# Patient Record
Sex: Female | Born: 1935 | Race: White | Hispanic: No | Smoking: Never smoker
Health system: Southern US, Community
[De-identification: ages and names within clinical notes are randomized; demographics above are authoritative.]

## PROBLEM LIST (undated history)

## (undated) DIAGNOSIS — I4891 Unspecified atrial fibrillation: Secondary | ICD-10-CM

---

## 2011-07-18 DIAGNOSIS — D510 Vitamin B12 deficiency anemia due to intrinsic factor deficiency: Secondary | ICD-10-CM | POA: Diagnosis not present

## 2011-07-18 DIAGNOSIS — Z7901 Long term (current) use of anticoagulants: Secondary | ICD-10-CM | POA: Diagnosis not present

## 2011-08-15 DIAGNOSIS — Z7901 Long term (current) use of anticoagulants: Secondary | ICD-10-CM | POA: Diagnosis not present

## 2011-08-15 DIAGNOSIS — D510 Vitamin B12 deficiency anemia due to intrinsic factor deficiency: Secondary | ICD-10-CM | POA: Diagnosis not present

## 2011-09-12 DIAGNOSIS — Z7901 Long term (current) use of anticoagulants: Secondary | ICD-10-CM | POA: Diagnosis not present

## 2011-09-12 DIAGNOSIS — D510 Vitamin B12 deficiency anemia due to intrinsic factor deficiency: Secondary | ICD-10-CM | POA: Diagnosis not present

## 2011-09-30 DIAGNOSIS — S199XXA Unspecified injury of neck, initial encounter: Secondary | ICD-10-CM | POA: Diagnosis not present

## 2011-09-30 DIAGNOSIS — Z7901 Long term (current) use of anticoagulants: Secondary | ICD-10-CM | POA: Diagnosis not present

## 2011-09-30 DIAGNOSIS — R296 Repeated falls: Secondary | ICD-10-CM | POA: Diagnosis not present

## 2011-09-30 DIAGNOSIS — I10 Essential (primary) hypertension: Secondary | ICD-10-CM | POA: Diagnosis not present

## 2011-09-30 DIAGNOSIS — R040 Epistaxis: Secondary | ICD-10-CM | POA: Diagnosis not present

## 2011-09-30 DIAGNOSIS — S0993XA Unspecified injury of face, initial encounter: Secondary | ICD-10-CM | POA: Diagnosis not present

## 2011-09-30 DIAGNOSIS — IMO0002 Reserved for concepts with insufficient information to code with codable children: Secondary | ICD-10-CM | POA: Diagnosis not present

## 2011-09-30 DIAGNOSIS — S022XXA Fracture of nasal bones, initial encounter for closed fracture: Secondary | ICD-10-CM | POA: Diagnosis not present

## 2011-09-30 DIAGNOSIS — I2510 Atherosclerotic heart disease of native coronary artery without angina pectoris: Secondary | ICD-10-CM | POA: Diagnosis not present

## 2011-09-30 DIAGNOSIS — S0120XA Unspecified open wound of nose, initial encounter: Secondary | ICD-10-CM | POA: Diagnosis not present

## 2011-10-12 DIAGNOSIS — D510 Vitamin B12 deficiency anemia due to intrinsic factor deficiency: Secondary | ICD-10-CM | POA: Diagnosis not present

## 2011-10-12 DIAGNOSIS — Z7901 Long term (current) use of anticoagulants: Secondary | ICD-10-CM | POA: Diagnosis not present

## 2011-10-17 DIAGNOSIS — Z7901 Long term (current) use of anticoagulants: Secondary | ICD-10-CM | POA: Diagnosis not present

## 2011-10-31 DIAGNOSIS — Z7901 Long term (current) use of anticoagulants: Secondary | ICD-10-CM | POA: Diagnosis not present

## 2011-11-02 DIAGNOSIS — Z7901 Long term (current) use of anticoagulants: Secondary | ICD-10-CM | POA: Diagnosis not present

## 2011-11-05 DIAGNOSIS — Z7901 Long term (current) use of anticoagulants: Secondary | ICD-10-CM | POA: Diagnosis not present

## 2011-11-12 DIAGNOSIS — Z7901 Long term (current) use of anticoagulants: Secondary | ICD-10-CM | POA: Diagnosis not present

## 2011-11-12 DIAGNOSIS — D510 Vitamin B12 deficiency anemia due to intrinsic factor deficiency: Secondary | ICD-10-CM | POA: Diagnosis not present

## 2011-11-12 DIAGNOSIS — I4891 Unspecified atrial fibrillation: Secondary | ICD-10-CM | POA: Diagnosis not present

## 2011-11-19 DIAGNOSIS — Z7901 Long term (current) use of anticoagulants: Secondary | ICD-10-CM | POA: Diagnosis not present

## 2011-12-04 DIAGNOSIS — Z7901 Long term (current) use of anticoagulants: Secondary | ICD-10-CM | POA: Diagnosis not present

## 2011-12-10 DIAGNOSIS — Z7901 Long term (current) use of anticoagulants: Secondary | ICD-10-CM | POA: Diagnosis not present

## 2011-12-24 DIAGNOSIS — Z7901 Long term (current) use of anticoagulants: Secondary | ICD-10-CM | POA: Diagnosis not present

## 2011-12-24 DIAGNOSIS — D510 Vitamin B12 deficiency anemia due to intrinsic factor deficiency: Secondary | ICD-10-CM | POA: Diagnosis not present

## 2011-12-31 DIAGNOSIS — Z7901 Long term (current) use of anticoagulants: Secondary | ICD-10-CM | POA: Diagnosis not present

## 2012-01-23 DIAGNOSIS — Z7901 Long term (current) use of anticoagulants: Secondary | ICD-10-CM | POA: Diagnosis not present

## 2012-01-23 DIAGNOSIS — D510 Vitamin B12 deficiency anemia due to intrinsic factor deficiency: Secondary | ICD-10-CM | POA: Diagnosis not present

## 2012-01-29 DIAGNOSIS — M949 Disorder of cartilage, unspecified: Secondary | ICD-10-CM | POA: Diagnosis not present

## 2012-01-29 DIAGNOSIS — Z79899 Other long term (current) drug therapy: Secondary | ICD-10-CM | POA: Diagnosis not present

## 2012-01-29 DIAGNOSIS — I10 Essential (primary) hypertension: Secondary | ICD-10-CM | POA: Diagnosis not present

## 2012-01-29 DIAGNOSIS — M899 Disorder of bone, unspecified: Secondary | ICD-10-CM | POA: Diagnosis not present

## 2012-01-29 DIAGNOSIS — Z7901 Long term (current) use of anticoagulants: Secondary | ICD-10-CM | POA: Diagnosis not present

## 2012-02-20 DIAGNOSIS — Z7901 Long term (current) use of anticoagulants: Secondary | ICD-10-CM | POA: Diagnosis not present

## 2012-02-20 DIAGNOSIS — Z79899 Other long term (current) drug therapy: Secondary | ICD-10-CM | POA: Diagnosis not present

## 2012-03-19 DIAGNOSIS — D510 Vitamin B12 deficiency anemia due to intrinsic factor deficiency: Secondary | ICD-10-CM | POA: Diagnosis not present

## 2012-03-19 DIAGNOSIS — Z7901 Long term (current) use of anticoagulants: Secondary | ICD-10-CM | POA: Diagnosis not present

## 2012-04-02 DIAGNOSIS — Z7901 Long term (current) use of anticoagulants: Secondary | ICD-10-CM | POA: Diagnosis not present

## 2012-04-02 DIAGNOSIS — Z23 Encounter for immunization: Secondary | ICD-10-CM | POA: Diagnosis not present

## 2012-04-09 DIAGNOSIS — Z7901 Long term (current) use of anticoagulants: Secondary | ICD-10-CM | POA: Diagnosis not present

## 2012-04-09 DIAGNOSIS — D510 Vitamin B12 deficiency anemia due to intrinsic factor deficiency: Secondary | ICD-10-CM | POA: Diagnosis not present

## 2012-04-23 DIAGNOSIS — D510 Vitamin B12 deficiency anemia due to intrinsic factor deficiency: Secondary | ICD-10-CM | POA: Diagnosis not present

## 2012-04-23 DIAGNOSIS — Z7901 Long term (current) use of anticoagulants: Secondary | ICD-10-CM | POA: Diagnosis not present

## 2012-05-21 DIAGNOSIS — D510 Vitamin B12 deficiency anemia due to intrinsic factor deficiency: Secondary | ICD-10-CM | POA: Diagnosis not present

## 2012-05-21 DIAGNOSIS — Z7901 Long term (current) use of anticoagulants: Secondary | ICD-10-CM | POA: Diagnosis not present

## 2012-06-04 DIAGNOSIS — Z7901 Long term (current) use of anticoagulants: Secondary | ICD-10-CM | POA: Diagnosis not present

## 2012-06-19 DIAGNOSIS — D510 Vitamin B12 deficiency anemia due to intrinsic factor deficiency: Secondary | ICD-10-CM | POA: Diagnosis not present

## 2012-07-03 DIAGNOSIS — Z7901 Long term (current) use of anticoagulants: Secondary | ICD-10-CM | POA: Diagnosis not present

## 2012-07-30 DIAGNOSIS — D510 Vitamin B12 deficiency anemia due to intrinsic factor deficiency: Secondary | ICD-10-CM | POA: Diagnosis not present

## 2012-07-30 DIAGNOSIS — Z7901 Long term (current) use of anticoagulants: Secondary | ICD-10-CM | POA: Diagnosis not present

## 2012-08-12 DIAGNOSIS — S72143A Displaced intertrochanteric fracture of unspecified femur, initial encounter for closed fracture: Secondary | ICD-10-CM | POA: Diagnosis not present

## 2012-08-12 DIAGNOSIS — S72009D Fracture of unspecified part of neck of unspecified femur, subsequent encounter for closed fracture with routine healing: Secondary | ICD-10-CM | POA: Diagnosis not present

## 2012-08-12 DIAGNOSIS — I2789 Other specified pulmonary heart diseases: Secondary | ICD-10-CM | POA: Diagnosis not present

## 2012-08-12 DIAGNOSIS — R262 Difficulty in walking, not elsewhere classified: Secondary | ICD-10-CM | POA: Diagnosis not present

## 2012-08-12 DIAGNOSIS — M2550 Pain in unspecified joint: Secondary | ICD-10-CM | POA: Diagnosis not present

## 2012-08-12 DIAGNOSIS — I501 Left ventricular failure: Secondary | ICD-10-CM | POA: Diagnosis not present

## 2012-08-12 DIAGNOSIS — D62 Acute posthemorrhagic anemia: Secondary | ICD-10-CM | POA: Diagnosis not present

## 2012-08-12 DIAGNOSIS — Z7901 Long term (current) use of anticoagulants: Secondary | ICD-10-CM | POA: Diagnosis not present

## 2012-08-12 DIAGNOSIS — R509 Fever, unspecified: Secondary | ICD-10-CM | POA: Diagnosis not present

## 2012-08-12 DIAGNOSIS — I10 Essential (primary) hypertension: Secondary | ICD-10-CM | POA: Diagnosis not present

## 2012-08-12 DIAGNOSIS — S7223XA Displaced subtrochanteric fracture of unspecified femur, initial encounter for closed fracture: Secondary | ICD-10-CM | POA: Diagnosis not present

## 2012-08-12 DIAGNOSIS — S72009A Fracture of unspecified part of neck of unspecified femur, initial encounter for closed fracture: Secondary | ICD-10-CM | POA: Diagnosis not present

## 2012-08-12 DIAGNOSIS — I359 Nonrheumatic aortic valve disorder, unspecified: Secondary | ICD-10-CM | POA: Diagnosis not present

## 2012-08-12 DIAGNOSIS — I509 Heart failure, unspecified: Secondary | ICD-10-CM | POA: Diagnosis not present

## 2012-08-12 DIAGNOSIS — Z9181 History of falling: Secondary | ICD-10-CM | POA: Diagnosis not present

## 2012-08-12 DIAGNOSIS — N390 Urinary tract infection, site not specified: Secondary | ICD-10-CM | POA: Diagnosis not present

## 2012-08-12 DIAGNOSIS — R296 Repeated falls: Secondary | ICD-10-CM | POA: Diagnosis not present

## 2012-08-12 DIAGNOSIS — E538 Deficiency of other specified B group vitamins: Secondary | ICD-10-CM | POA: Diagnosis not present

## 2012-08-12 DIAGNOSIS — I4891 Unspecified atrial fibrillation: Secondary | ICD-10-CM | POA: Diagnosis not present

## 2012-08-12 DIAGNOSIS — R404 Transient alteration of awareness: Secondary | ICD-10-CM | POA: Diagnosis not present

## 2012-08-12 DIAGNOSIS — Z8673 Personal history of transient ischemic attack (TIA), and cerebral infarction without residual deficits: Secondary | ICD-10-CM | POA: Diagnosis not present

## 2012-08-12 DIAGNOSIS — I369 Nonrheumatic tricuspid valve disorder, unspecified: Secondary | ICD-10-CM | POA: Diagnosis not present

## 2012-08-12 DIAGNOSIS — J810 Acute pulmonary edema: Secondary | ICD-10-CM | POA: Diagnosis not present

## 2012-08-12 DIAGNOSIS — I5031 Acute diastolic (congestive) heart failure: Secondary | ICD-10-CM | POA: Diagnosis not present

## 2012-08-12 DIAGNOSIS — M6250 Muscle wasting and atrophy, not elsewhere classified, unspecified site: Secondary | ICD-10-CM | POA: Diagnosis not present

## 2012-08-12 DIAGNOSIS — I059 Rheumatic mitral valve disease, unspecified: Secondary | ICD-10-CM | POA: Diagnosis not present

## 2012-08-12 DIAGNOSIS — R9431 Abnormal electrocardiogram [ECG] [EKG]: Secondary | ICD-10-CM | POA: Diagnosis not present

## 2012-08-12 DIAGNOSIS — M25659 Stiffness of unspecified hip, not elsewhere classified: Secondary | ICD-10-CM | POA: Diagnosis not present

## 2012-08-17 DIAGNOSIS — S7223XA Displaced subtrochanteric fracture of unspecified femur, initial encounter for closed fracture: Secondary | ICD-10-CM | POA: Diagnosis not present

## 2012-08-17 DIAGNOSIS — I4891 Unspecified atrial fibrillation: Secondary | ICD-10-CM | POA: Diagnosis not present

## 2012-08-17 DIAGNOSIS — I10 Essential (primary) hypertension: Secondary | ICD-10-CM | POA: Diagnosis not present

## 2012-08-17 DIAGNOSIS — S72009D Fracture of unspecified part of neck of unspecified femur, subsequent encounter for closed fracture with routine healing: Secondary | ICD-10-CM | POA: Diagnosis not present

## 2012-08-17 DIAGNOSIS — M79609 Pain in unspecified limb: Secondary | ICD-10-CM | POA: Diagnosis not present

## 2012-08-17 DIAGNOSIS — J810 Acute pulmonary edema: Secondary | ICD-10-CM | POA: Diagnosis not present

## 2012-08-17 DIAGNOSIS — R609 Edema, unspecified: Secondary | ICD-10-CM | POA: Diagnosis not present

## 2012-08-17 DIAGNOSIS — R269 Unspecified abnormalities of gait and mobility: Secondary | ICD-10-CM | POA: Diagnosis not present

## 2012-08-17 DIAGNOSIS — M25659 Stiffness of unspecified hip, not elsewhere classified: Secondary | ICD-10-CM | POA: Diagnosis not present

## 2012-08-17 DIAGNOSIS — Z9181 History of falling: Secondary | ICD-10-CM | POA: Diagnosis not present

## 2012-08-17 DIAGNOSIS — I509 Heart failure, unspecified: Secondary | ICD-10-CM | POA: Diagnosis not present

## 2012-08-17 DIAGNOSIS — M6250 Muscle wasting and atrophy, not elsewhere classified, unspecified site: Secondary | ICD-10-CM | POA: Diagnosis not present

## 2012-08-17 DIAGNOSIS — R262 Difficulty in walking, not elsewhere classified: Secondary | ICD-10-CM | POA: Diagnosis not present

## 2012-08-17 DIAGNOSIS — D62 Acute posthemorrhagic anemia: Secondary | ICD-10-CM | POA: Diagnosis not present

## 2012-08-17 DIAGNOSIS — M2550 Pain in unspecified joint: Secondary | ICD-10-CM | POA: Diagnosis not present

## 2012-09-02 DIAGNOSIS — R609 Edema, unspecified: Secondary | ICD-10-CM | POA: Diagnosis not present

## 2012-09-02 DIAGNOSIS — I509 Heart failure, unspecified: Secondary | ICD-10-CM | POA: Diagnosis not present

## 2012-09-02 DIAGNOSIS — I4891 Unspecified atrial fibrillation: Secondary | ICD-10-CM | POA: Diagnosis not present

## 2012-09-02 DIAGNOSIS — R269 Unspecified abnormalities of gait and mobility: Secondary | ICD-10-CM | POA: Diagnosis not present

## 2012-09-09 DIAGNOSIS — I4891 Unspecified atrial fibrillation: Secondary | ICD-10-CM | POA: Diagnosis not present

## 2012-09-09 DIAGNOSIS — R269 Unspecified abnormalities of gait and mobility: Secondary | ICD-10-CM | POA: Diagnosis not present

## 2012-09-09 DIAGNOSIS — I509 Heart failure, unspecified: Secondary | ICD-10-CM | POA: Diagnosis not present

## 2012-09-09 DIAGNOSIS — R609 Edema, unspecified: Secondary | ICD-10-CM | POA: Diagnosis not present

## 2012-09-14 DIAGNOSIS — I10 Essential (primary) hypertension: Secondary | ICD-10-CM | POA: Diagnosis not present

## 2012-09-14 DIAGNOSIS — R32 Unspecified urinary incontinence: Secondary | ICD-10-CM | POA: Diagnosis not present

## 2012-09-14 DIAGNOSIS — I4891 Unspecified atrial fibrillation: Secondary | ICD-10-CM | POA: Diagnosis not present

## 2012-09-14 DIAGNOSIS — I509 Heart failure, unspecified: Secondary | ICD-10-CM | POA: Diagnosis not present

## 2012-09-14 DIAGNOSIS — Z7901 Long term (current) use of anticoagulants: Secondary | ICD-10-CM | POA: Diagnosis not present

## 2012-09-14 DIAGNOSIS — Z9181 History of falling: Secondary | ICD-10-CM | POA: Diagnosis not present

## 2012-09-14 DIAGNOSIS — I5032 Chronic diastolic (congestive) heart failure: Secondary | ICD-10-CM | POA: Diagnosis not present

## 2012-09-14 DIAGNOSIS — S72009D Fracture of unspecified part of neck of unspecified femur, subsequent encounter for closed fracture with routine healing: Secondary | ICD-10-CM | POA: Diagnosis not present

## 2012-09-15 DIAGNOSIS — I10 Essential (primary) hypertension: Secondary | ICD-10-CM | POA: Diagnosis not present

## 2012-09-15 DIAGNOSIS — M810 Age-related osteoporosis without current pathological fracture: Secondary | ICD-10-CM | POA: Diagnosis not present

## 2012-09-15 DIAGNOSIS — I4891 Unspecified atrial fibrillation: Secondary | ICD-10-CM | POA: Diagnosis not present

## 2012-09-16 DIAGNOSIS — S72009D Fracture of unspecified part of neck of unspecified femur, subsequent encounter for closed fracture with routine healing: Secondary | ICD-10-CM | POA: Diagnosis not present

## 2012-09-16 DIAGNOSIS — R32 Unspecified urinary incontinence: Secondary | ICD-10-CM | POA: Diagnosis not present

## 2012-09-16 DIAGNOSIS — I5032 Chronic diastolic (congestive) heart failure: Secondary | ICD-10-CM | POA: Diagnosis not present

## 2012-09-16 DIAGNOSIS — I509 Heart failure, unspecified: Secondary | ICD-10-CM | POA: Diagnosis not present

## 2012-09-16 DIAGNOSIS — I4891 Unspecified atrial fibrillation: Secondary | ICD-10-CM | POA: Diagnosis not present

## 2012-09-16 DIAGNOSIS — I10 Essential (primary) hypertension: Secondary | ICD-10-CM | POA: Diagnosis not present

## 2012-09-17 DIAGNOSIS — M6281 Muscle weakness (generalized): Secondary | ICD-10-CM | POA: Diagnosis not present

## 2012-09-17 DIAGNOSIS — E785 Hyperlipidemia, unspecified: Secondary | ICD-10-CM | POA: Diagnosis present

## 2012-09-17 DIAGNOSIS — I5032 Chronic diastolic (congestive) heart failure: Secondary | ICD-10-CM | POA: Diagnosis not present

## 2012-09-17 DIAGNOSIS — N390 Urinary tract infection, site not specified: Secondary | ICD-10-CM | POA: Diagnosis not present

## 2012-09-17 DIAGNOSIS — G929 Unspecified toxic encephalopathy: Secondary | ICD-10-CM | POA: Diagnosis present

## 2012-09-17 DIAGNOSIS — J438 Other emphysema: Secondary | ICD-10-CM | POA: Diagnosis not present

## 2012-09-17 DIAGNOSIS — I509 Heart failure, unspecified: Secondary | ICD-10-CM | POA: Diagnosis not present

## 2012-09-17 DIAGNOSIS — I4891 Unspecified atrial fibrillation: Secondary | ICD-10-CM | POA: Diagnosis not present

## 2012-09-17 DIAGNOSIS — R5381 Other malaise: Secondary | ICD-10-CM | POA: Diagnosis not present

## 2012-09-17 DIAGNOSIS — D638 Anemia in other chronic diseases classified elsewhere: Secondary | ICD-10-CM | POA: Diagnosis present

## 2012-09-17 DIAGNOSIS — R4182 Altered mental status, unspecified: Secondary | ICD-10-CM | POA: Diagnosis not present

## 2012-09-17 DIAGNOSIS — Z79899 Other long term (current) drug therapy: Secondary | ICD-10-CM | POA: Diagnosis not present

## 2012-09-17 DIAGNOSIS — Z7901 Long term (current) use of anticoagulants: Secondary | ICD-10-CM | POA: Diagnosis not present

## 2012-09-17 DIAGNOSIS — M25659 Stiffness of unspecified hip, not elsewhere classified: Secondary | ICD-10-CM | POA: Diagnosis not present

## 2012-09-17 DIAGNOSIS — S72009D Fracture of unspecified part of neck of unspecified femur, subsequent encounter for closed fracture with routine healing: Secondary | ICD-10-CM | POA: Diagnosis not present

## 2012-09-17 DIAGNOSIS — R6889 Other general symptoms and signs: Secondary | ICD-10-CM | POA: Diagnosis not present

## 2012-09-17 DIAGNOSIS — I10 Essential (primary) hypertension: Secondary | ICD-10-CM | POA: Diagnosis not present

## 2012-09-17 DIAGNOSIS — R1310 Dysphagia, unspecified: Secondary | ICD-10-CM | POA: Diagnosis not present

## 2012-09-17 DIAGNOSIS — R32 Unspecified urinary incontinence: Secondary | ICD-10-CM | POA: Diagnosis not present

## 2012-09-17 DIAGNOSIS — F068 Other specified mental disorders due to known physiological condition: Secondary | ICD-10-CM | POA: Diagnosis not present

## 2012-09-17 DIAGNOSIS — I6789 Other cerebrovascular disease: Secondary | ICD-10-CM | POA: Diagnosis not present

## 2012-09-17 DIAGNOSIS — I6340 Cerebral infarction due to embolism of unspecified cerebral artery: Secondary | ICD-10-CM | POA: Diagnosis not present

## 2012-09-17 DIAGNOSIS — M6250 Muscle wasting and atrophy, not elsewhere classified, unspecified site: Secondary | ICD-10-CM | POA: Diagnosis not present

## 2012-09-17 DIAGNOSIS — G92 Toxic encephalopathy: Secondary | ICD-10-CM | POA: Diagnosis not present

## 2012-09-17 DIAGNOSIS — Z9181 History of falling: Secondary | ICD-10-CM | POA: Diagnosis not present

## 2012-09-17 DIAGNOSIS — R471 Dysarthria and anarthria: Secondary | ICD-10-CM | POA: Diagnosis present

## 2012-09-17 DIAGNOSIS — R269 Unspecified abnormalities of gait and mobility: Secondary | ICD-10-CM | POA: Diagnosis not present

## 2012-09-17 DIAGNOSIS — R262 Difficulty in walking, not elsewhere classified: Secondary | ICD-10-CM | POA: Diagnosis not present

## 2012-09-17 DIAGNOSIS — J690 Pneumonitis due to inhalation of food and vomit: Secondary | ICD-10-CM | POA: Diagnosis not present

## 2012-09-17 DIAGNOSIS — I6350 Cerebral infarction due to unspecified occlusion or stenosis of unspecified cerebral artery: Secondary | ICD-10-CM | POA: Diagnosis not present

## 2012-09-17 DIAGNOSIS — J189 Pneumonia, unspecified organism: Secondary | ICD-10-CM | POA: Diagnosis not present

## 2012-09-17 DIAGNOSIS — I69959 Hemiplegia and hemiparesis following unspecified cerebrovascular disease affecting unspecified side: Secondary | ICD-10-CM | POA: Diagnosis not present

## 2012-09-17 DIAGNOSIS — I679 Cerebrovascular disease, unspecified: Secondary | ICD-10-CM | POA: Diagnosis not present

## 2012-09-17 DIAGNOSIS — F4489 Other dissociative and conversion disorders: Secondary | ICD-10-CM | POA: Diagnosis not present

## 2012-09-22 DIAGNOSIS — I6789 Other cerebrovascular disease: Secondary | ICD-10-CM | POA: Diagnosis not present

## 2012-09-22 DIAGNOSIS — M25659 Stiffness of unspecified hip, not elsewhere classified: Secondary | ICD-10-CM | POA: Diagnosis not present

## 2012-09-22 DIAGNOSIS — R269 Unspecified abnormalities of gait and mobility: Secondary | ICD-10-CM | POA: Diagnosis not present

## 2012-09-22 DIAGNOSIS — S72143A Displaced intertrochanteric fracture of unspecified femur, initial encounter for closed fracture: Secondary | ICD-10-CM | POA: Diagnosis not present

## 2012-09-22 DIAGNOSIS — D649 Anemia, unspecified: Secondary | ICD-10-CM | POA: Diagnosis not present

## 2012-09-22 DIAGNOSIS — I6340 Cerebral infarction due to embolism of unspecified cerebral artery: Secondary | ICD-10-CM | POA: Diagnosis not present

## 2012-09-22 DIAGNOSIS — Z7901 Long term (current) use of anticoagulants: Secondary | ICD-10-CM | POA: Diagnosis not present

## 2012-09-22 DIAGNOSIS — J690 Pneumonitis due to inhalation of food and vomit: Secondary | ICD-10-CM | POA: Diagnosis not present

## 2012-09-22 DIAGNOSIS — J189 Pneumonia, unspecified organism: Secondary | ICD-10-CM | POA: Diagnosis not present

## 2012-09-22 DIAGNOSIS — R262 Difficulty in walking, not elsewhere classified: Secondary | ICD-10-CM | POA: Diagnosis not present

## 2012-09-22 DIAGNOSIS — M6250 Muscle wasting and atrophy, not elsewhere classified, unspecified site: Secondary | ICD-10-CM | POA: Diagnosis not present

## 2012-09-22 DIAGNOSIS — I10 Essential (primary) hypertension: Secondary | ICD-10-CM | POA: Diagnosis not present

## 2012-09-22 DIAGNOSIS — M6281 Muscle weakness (generalized): Secondary | ICD-10-CM | POA: Diagnosis not present

## 2012-09-22 DIAGNOSIS — I69959 Hemiplegia and hemiparesis following unspecified cerebrovascular disease affecting unspecified side: Secondary | ICD-10-CM | POA: Diagnosis not present

## 2012-09-22 DIAGNOSIS — Z79899 Other long term (current) drug therapy: Secondary | ICD-10-CM | POA: Diagnosis not present

## 2012-09-22 DIAGNOSIS — D510 Vitamin B12 deficiency anemia due to intrinsic factor deficiency: Secondary | ICD-10-CM | POA: Diagnosis not present

## 2012-09-22 DIAGNOSIS — Z9181 History of falling: Secondary | ICD-10-CM | POA: Diagnosis not present

## 2012-09-22 DIAGNOSIS — I4891 Unspecified atrial fibrillation: Secondary | ICD-10-CM | POA: Diagnosis not present

## 2012-09-22 DIAGNOSIS — I509 Heart failure, unspecified: Secondary | ICD-10-CM | POA: Diagnosis not present

## 2012-09-22 DIAGNOSIS — R609 Edema, unspecified: Secondary | ICD-10-CM | POA: Diagnosis not present

## 2012-09-25 DIAGNOSIS — S72143A Displaced intertrochanteric fracture of unspecified femur, initial encounter for closed fracture: Secondary | ICD-10-CM | POA: Diagnosis not present

## 2012-10-03 DIAGNOSIS — I10 Essential (primary) hypertension: Secondary | ICD-10-CM | POA: Diagnosis not present

## 2012-10-03 DIAGNOSIS — I4891 Unspecified atrial fibrillation: Secondary | ICD-10-CM | POA: Diagnosis not present

## 2012-10-03 DIAGNOSIS — I6789 Other cerebrovascular disease: Secondary | ICD-10-CM | POA: Diagnosis not present

## 2012-10-03 DIAGNOSIS — R609 Edema, unspecified: Secondary | ICD-10-CM | POA: Diagnosis not present

## 2012-10-07 DIAGNOSIS — R609 Edema, unspecified: Secondary | ICD-10-CM | POA: Diagnosis not present

## 2012-10-07 DIAGNOSIS — I4891 Unspecified atrial fibrillation: Secondary | ICD-10-CM | POA: Diagnosis not present

## 2012-10-07 DIAGNOSIS — R269 Unspecified abnormalities of gait and mobility: Secondary | ICD-10-CM | POA: Diagnosis not present

## 2012-10-07 DIAGNOSIS — I509 Heart failure, unspecified: Secondary | ICD-10-CM | POA: Diagnosis not present

## 2012-10-14 DIAGNOSIS — I10 Essential (primary) hypertension: Secondary | ICD-10-CM | POA: Diagnosis not present

## 2012-10-14 DIAGNOSIS — I509 Heart failure, unspecified: Secondary | ICD-10-CM | POA: Diagnosis not present

## 2012-10-14 DIAGNOSIS — I4891 Unspecified atrial fibrillation: Secondary | ICD-10-CM | POA: Diagnosis not present

## 2012-10-14 DIAGNOSIS — I5032 Chronic diastolic (congestive) heart failure: Secondary | ICD-10-CM | POA: Diagnosis not present

## 2012-10-14 DIAGNOSIS — S72009D Fracture of unspecified part of neck of unspecified femur, subsequent encounter for closed fracture with routine healing: Secondary | ICD-10-CM | POA: Diagnosis not present

## 2012-10-14 DIAGNOSIS — R32 Unspecified urinary incontinence: Secondary | ICD-10-CM | POA: Diagnosis not present

## 2012-10-15 DIAGNOSIS — S72009D Fracture of unspecified part of neck of unspecified femur, subsequent encounter for closed fracture with routine healing: Secondary | ICD-10-CM | POA: Diagnosis not present

## 2012-10-15 DIAGNOSIS — I509 Heart failure, unspecified: Secondary | ICD-10-CM | POA: Diagnosis not present

## 2012-10-15 DIAGNOSIS — I10 Essential (primary) hypertension: Secondary | ICD-10-CM | POA: Diagnosis not present

## 2012-10-15 DIAGNOSIS — I5032 Chronic diastolic (congestive) heart failure: Secondary | ICD-10-CM | POA: Diagnosis not present

## 2012-10-15 DIAGNOSIS — I4891 Unspecified atrial fibrillation: Secondary | ICD-10-CM | POA: Diagnosis not present

## 2012-10-15 DIAGNOSIS — R32 Unspecified urinary incontinence: Secondary | ICD-10-CM | POA: Diagnosis not present

## 2012-10-16 DIAGNOSIS — E7800 Pure hypercholesterolemia, unspecified: Secondary | ICD-10-CM | POA: Diagnosis not present

## 2012-10-16 DIAGNOSIS — I10 Essential (primary) hypertension: Secondary | ICD-10-CM | POA: Diagnosis not present

## 2012-10-16 DIAGNOSIS — I6330 Cerebral infarction due to thrombosis of unspecified cerebral artery: Secondary | ICD-10-CM | POA: Diagnosis not present

## 2012-10-20 DIAGNOSIS — I4891 Unspecified atrial fibrillation: Secondary | ICD-10-CM | POA: Diagnosis not present

## 2012-10-20 DIAGNOSIS — I10 Essential (primary) hypertension: Secondary | ICD-10-CM | POA: Diagnosis not present

## 2012-10-20 DIAGNOSIS — I5032 Chronic diastolic (congestive) heart failure: Secondary | ICD-10-CM | POA: Diagnosis not present

## 2012-10-20 DIAGNOSIS — I509 Heart failure, unspecified: Secondary | ICD-10-CM | POA: Diagnosis not present

## 2012-10-20 DIAGNOSIS — R32 Unspecified urinary incontinence: Secondary | ICD-10-CM | POA: Diagnosis not present

## 2012-10-20 DIAGNOSIS — S72009D Fracture of unspecified part of neck of unspecified femur, subsequent encounter for closed fracture with routine healing: Secondary | ICD-10-CM | POA: Diagnosis not present

## 2012-10-21 DIAGNOSIS — S72009D Fracture of unspecified part of neck of unspecified femur, subsequent encounter for closed fracture with routine healing: Secondary | ICD-10-CM | POA: Diagnosis not present

## 2012-10-21 DIAGNOSIS — I5032 Chronic diastolic (congestive) heart failure: Secondary | ICD-10-CM | POA: Diagnosis not present

## 2012-10-21 DIAGNOSIS — I509 Heart failure, unspecified: Secondary | ICD-10-CM | POA: Diagnosis not present

## 2012-10-21 DIAGNOSIS — I10 Essential (primary) hypertension: Secondary | ICD-10-CM | POA: Diagnosis not present

## 2012-10-21 DIAGNOSIS — I4891 Unspecified atrial fibrillation: Secondary | ICD-10-CM | POA: Diagnosis not present

## 2012-10-21 DIAGNOSIS — R32 Unspecified urinary incontinence: Secondary | ICD-10-CM | POA: Diagnosis not present

## 2012-10-22 DIAGNOSIS — S72009D Fracture of unspecified part of neck of unspecified femur, subsequent encounter for closed fracture with routine healing: Secondary | ICD-10-CM | POA: Diagnosis not present

## 2012-10-22 DIAGNOSIS — I4891 Unspecified atrial fibrillation: Secondary | ICD-10-CM | POA: Diagnosis not present

## 2012-10-22 DIAGNOSIS — I509 Heart failure, unspecified: Secondary | ICD-10-CM | POA: Diagnosis not present

## 2012-10-22 DIAGNOSIS — I10 Essential (primary) hypertension: Secondary | ICD-10-CM | POA: Diagnosis not present

## 2012-10-22 DIAGNOSIS — R32 Unspecified urinary incontinence: Secondary | ICD-10-CM | POA: Diagnosis not present

## 2012-10-22 DIAGNOSIS — I5032 Chronic diastolic (congestive) heart failure: Secondary | ICD-10-CM | POA: Diagnosis not present

## 2012-10-23 DIAGNOSIS — I4891 Unspecified atrial fibrillation: Secondary | ICD-10-CM | POA: Diagnosis not present

## 2012-10-23 DIAGNOSIS — I5032 Chronic diastolic (congestive) heart failure: Secondary | ICD-10-CM | POA: Diagnosis not present

## 2012-10-23 DIAGNOSIS — I10 Essential (primary) hypertension: Secondary | ICD-10-CM | POA: Diagnosis not present

## 2012-10-23 DIAGNOSIS — S72009D Fracture of unspecified part of neck of unspecified femur, subsequent encounter for closed fracture with routine healing: Secondary | ICD-10-CM | POA: Diagnosis not present

## 2012-10-23 DIAGNOSIS — I509 Heart failure, unspecified: Secondary | ICD-10-CM | POA: Diagnosis not present

## 2012-10-23 DIAGNOSIS — R32 Unspecified urinary incontinence: Secondary | ICD-10-CM | POA: Diagnosis not present

## 2012-10-28 DIAGNOSIS — I10 Essential (primary) hypertension: Secondary | ICD-10-CM | POA: Diagnosis not present

## 2012-10-28 DIAGNOSIS — I509 Heart failure, unspecified: Secondary | ICD-10-CM | POA: Diagnosis not present

## 2012-10-28 DIAGNOSIS — I5032 Chronic diastolic (congestive) heart failure: Secondary | ICD-10-CM | POA: Diagnosis not present

## 2012-10-28 DIAGNOSIS — I4891 Unspecified atrial fibrillation: Secondary | ICD-10-CM | POA: Diagnosis not present

## 2012-10-28 DIAGNOSIS — S72009D Fracture of unspecified part of neck of unspecified femur, subsequent encounter for closed fracture with routine healing: Secondary | ICD-10-CM | POA: Diagnosis not present

## 2012-10-28 DIAGNOSIS — R32 Unspecified urinary incontinence: Secondary | ICD-10-CM | POA: Diagnosis not present

## 2012-10-30 DIAGNOSIS — I5032 Chronic diastolic (congestive) heart failure: Secondary | ICD-10-CM | POA: Diagnosis not present

## 2012-10-30 DIAGNOSIS — I4891 Unspecified atrial fibrillation: Secondary | ICD-10-CM | POA: Diagnosis not present

## 2012-10-30 DIAGNOSIS — I10 Essential (primary) hypertension: Secondary | ICD-10-CM | POA: Diagnosis not present

## 2012-10-30 DIAGNOSIS — R32 Unspecified urinary incontinence: Secondary | ICD-10-CM | POA: Diagnosis not present

## 2012-10-30 DIAGNOSIS — S72009D Fracture of unspecified part of neck of unspecified femur, subsequent encounter for closed fracture with routine healing: Secondary | ICD-10-CM | POA: Diagnosis not present

## 2012-10-30 DIAGNOSIS — I509 Heart failure, unspecified: Secondary | ICD-10-CM | POA: Diagnosis not present

## 2012-10-31 DIAGNOSIS — I5032 Chronic diastolic (congestive) heart failure: Secondary | ICD-10-CM | POA: Diagnosis not present

## 2012-10-31 DIAGNOSIS — I4891 Unspecified atrial fibrillation: Secondary | ICD-10-CM | POA: Diagnosis not present

## 2012-10-31 DIAGNOSIS — I509 Heart failure, unspecified: Secondary | ICD-10-CM | POA: Diagnosis not present

## 2012-10-31 DIAGNOSIS — I10 Essential (primary) hypertension: Secondary | ICD-10-CM | POA: Diagnosis not present

## 2012-10-31 DIAGNOSIS — R32 Unspecified urinary incontinence: Secondary | ICD-10-CM | POA: Diagnosis not present

## 2012-10-31 DIAGNOSIS — S72009D Fracture of unspecified part of neck of unspecified femur, subsequent encounter for closed fracture with routine healing: Secondary | ICD-10-CM | POA: Diagnosis not present

## 2012-11-04 DIAGNOSIS — S72009D Fracture of unspecified part of neck of unspecified femur, subsequent encounter for closed fracture with routine healing: Secondary | ICD-10-CM | POA: Diagnosis not present

## 2012-11-04 DIAGNOSIS — R32 Unspecified urinary incontinence: Secondary | ICD-10-CM | POA: Diagnosis not present

## 2012-11-04 DIAGNOSIS — I509 Heart failure, unspecified: Secondary | ICD-10-CM | POA: Diagnosis not present

## 2012-11-04 DIAGNOSIS — I4891 Unspecified atrial fibrillation: Secondary | ICD-10-CM | POA: Diagnosis not present

## 2012-11-04 DIAGNOSIS — I5032 Chronic diastolic (congestive) heart failure: Secondary | ICD-10-CM | POA: Diagnosis not present

## 2012-11-04 DIAGNOSIS — I10 Essential (primary) hypertension: Secondary | ICD-10-CM | POA: Diagnosis not present

## 2012-11-05 DIAGNOSIS — S72009D Fracture of unspecified part of neck of unspecified femur, subsequent encounter for closed fracture with routine healing: Secondary | ICD-10-CM | POA: Diagnosis not present

## 2012-11-05 DIAGNOSIS — I5032 Chronic diastolic (congestive) heart failure: Secondary | ICD-10-CM | POA: Diagnosis not present

## 2012-11-05 DIAGNOSIS — R32 Unspecified urinary incontinence: Secondary | ICD-10-CM | POA: Diagnosis not present

## 2012-11-05 DIAGNOSIS — I4891 Unspecified atrial fibrillation: Secondary | ICD-10-CM | POA: Diagnosis not present

## 2012-11-05 DIAGNOSIS — I10 Essential (primary) hypertension: Secondary | ICD-10-CM | POA: Diagnosis not present

## 2012-11-05 DIAGNOSIS — I509 Heart failure, unspecified: Secondary | ICD-10-CM | POA: Diagnosis not present

## 2012-11-06 DIAGNOSIS — I4891 Unspecified atrial fibrillation: Secondary | ICD-10-CM | POA: Diagnosis not present

## 2012-11-06 DIAGNOSIS — I10 Essential (primary) hypertension: Secondary | ICD-10-CM | POA: Diagnosis not present

## 2012-11-06 DIAGNOSIS — M25569 Pain in unspecified knee: Secondary | ICD-10-CM | POA: Diagnosis not present

## 2012-11-06 DIAGNOSIS — I509 Heart failure, unspecified: Secondary | ICD-10-CM | POA: Diagnosis not present

## 2012-11-06 DIAGNOSIS — M1710 Unilateral primary osteoarthritis, unspecified knee: Secondary | ICD-10-CM | POA: Diagnosis not present

## 2012-11-06 DIAGNOSIS — I5032 Chronic diastolic (congestive) heart failure: Secondary | ICD-10-CM | POA: Diagnosis not present

## 2012-11-06 DIAGNOSIS — S72009D Fracture of unspecified part of neck of unspecified femur, subsequent encounter for closed fracture with routine healing: Secondary | ICD-10-CM | POA: Diagnosis not present

## 2012-11-06 DIAGNOSIS — S72143A Displaced intertrochanteric fracture of unspecified femur, initial encounter for closed fracture: Secondary | ICD-10-CM | POA: Diagnosis not present

## 2012-11-06 DIAGNOSIS — Z7901 Long term (current) use of anticoagulants: Secondary | ICD-10-CM | POA: Diagnosis not present

## 2012-11-06 DIAGNOSIS — R32 Unspecified urinary incontinence: Secondary | ICD-10-CM | POA: Diagnosis not present

## 2012-11-10 DIAGNOSIS — I509 Heart failure, unspecified: Secondary | ICD-10-CM | POA: Diagnosis not present

## 2012-11-10 DIAGNOSIS — I5032 Chronic diastolic (congestive) heart failure: Secondary | ICD-10-CM | POA: Diagnosis not present

## 2012-11-10 DIAGNOSIS — I10 Essential (primary) hypertension: Secondary | ICD-10-CM | POA: Diagnosis not present

## 2012-11-10 DIAGNOSIS — R32 Unspecified urinary incontinence: Secondary | ICD-10-CM | POA: Diagnosis not present

## 2012-11-10 DIAGNOSIS — I4891 Unspecified atrial fibrillation: Secondary | ICD-10-CM | POA: Diagnosis not present

## 2012-11-10 DIAGNOSIS — S72009D Fracture of unspecified part of neck of unspecified femur, subsequent encounter for closed fracture with routine healing: Secondary | ICD-10-CM | POA: Diagnosis not present

## 2012-11-12 DIAGNOSIS — R32 Unspecified urinary incontinence: Secondary | ICD-10-CM | POA: Diagnosis not present

## 2012-11-12 DIAGNOSIS — S72009D Fracture of unspecified part of neck of unspecified femur, subsequent encounter for closed fracture with routine healing: Secondary | ICD-10-CM | POA: Diagnosis not present

## 2012-11-12 DIAGNOSIS — I10 Essential (primary) hypertension: Secondary | ICD-10-CM | POA: Diagnosis not present

## 2012-11-12 DIAGNOSIS — I509 Heart failure, unspecified: Secondary | ICD-10-CM | POA: Diagnosis not present

## 2012-11-12 DIAGNOSIS — I5032 Chronic diastolic (congestive) heart failure: Secondary | ICD-10-CM | POA: Diagnosis not present

## 2012-11-12 DIAGNOSIS — I4891 Unspecified atrial fibrillation: Secondary | ICD-10-CM | POA: Diagnosis not present

## 2012-11-13 DIAGNOSIS — R32 Unspecified urinary incontinence: Secondary | ICD-10-CM | POA: Diagnosis not present

## 2012-11-13 DIAGNOSIS — F0390 Unspecified dementia without behavioral disturbance: Secondary | ICD-10-CM | POA: Diagnosis not present

## 2012-11-13 DIAGNOSIS — Z5181 Encounter for therapeutic drug level monitoring: Secondary | ICD-10-CM | POA: Diagnosis not present

## 2012-11-13 DIAGNOSIS — I509 Heart failure, unspecified: Secondary | ICD-10-CM | POA: Diagnosis not present

## 2012-11-13 DIAGNOSIS — R488 Other symbolic dysfunctions: Secondary | ICD-10-CM | POA: Diagnosis not present

## 2012-11-13 DIAGNOSIS — Z9181 History of falling: Secondary | ICD-10-CM | POA: Diagnosis not present

## 2012-11-13 DIAGNOSIS — I5032 Chronic diastolic (congestive) heart failure: Secondary | ICD-10-CM | POA: Diagnosis not present

## 2012-11-13 DIAGNOSIS — I4891 Unspecified atrial fibrillation: Secondary | ICD-10-CM | POA: Diagnosis not present

## 2012-11-13 DIAGNOSIS — Z7901 Long term (current) use of anticoagulants: Secondary | ICD-10-CM | POA: Diagnosis not present

## 2012-11-13 DIAGNOSIS — I10 Essential (primary) hypertension: Secondary | ICD-10-CM | POA: Diagnosis not present

## 2012-11-19 DIAGNOSIS — I4891 Unspecified atrial fibrillation: Secondary | ICD-10-CM | POA: Diagnosis not present

## 2012-11-19 DIAGNOSIS — R32 Unspecified urinary incontinence: Secondary | ICD-10-CM | POA: Diagnosis not present

## 2012-11-19 DIAGNOSIS — I509 Heart failure, unspecified: Secondary | ICD-10-CM | POA: Diagnosis not present

## 2012-11-19 DIAGNOSIS — F0390 Unspecified dementia without behavioral disturbance: Secondary | ICD-10-CM | POA: Diagnosis not present

## 2012-11-19 DIAGNOSIS — I5032 Chronic diastolic (congestive) heart failure: Secondary | ICD-10-CM | POA: Diagnosis not present

## 2012-11-19 DIAGNOSIS — I10 Essential (primary) hypertension: Secondary | ICD-10-CM | POA: Diagnosis not present

## 2012-11-20 DIAGNOSIS — D510 Vitamin B12 deficiency anemia due to intrinsic factor deficiency: Secondary | ICD-10-CM | POA: Diagnosis not present

## 2012-11-20 DIAGNOSIS — I4891 Unspecified atrial fibrillation: Secondary | ICD-10-CM | POA: Diagnosis not present

## 2012-11-20 DIAGNOSIS — Z7901 Long term (current) use of anticoagulants: Secondary | ICD-10-CM | POA: Diagnosis not present

## 2012-11-26 DIAGNOSIS — I509 Heart failure, unspecified: Secondary | ICD-10-CM | POA: Diagnosis not present

## 2012-11-26 DIAGNOSIS — I4891 Unspecified atrial fibrillation: Secondary | ICD-10-CM | POA: Diagnosis not present

## 2012-11-26 DIAGNOSIS — I5032 Chronic diastolic (congestive) heart failure: Secondary | ICD-10-CM | POA: Diagnosis not present

## 2012-11-26 DIAGNOSIS — I10 Essential (primary) hypertension: Secondary | ICD-10-CM | POA: Diagnosis not present

## 2012-11-26 DIAGNOSIS — F0390 Unspecified dementia without behavioral disturbance: Secondary | ICD-10-CM | POA: Diagnosis not present

## 2012-11-26 DIAGNOSIS — R32 Unspecified urinary incontinence: Secondary | ICD-10-CM | POA: Diagnosis not present

## 2012-12-04 DIAGNOSIS — Z7901 Long term (current) use of anticoagulants: Secondary | ICD-10-CM | POA: Diagnosis not present

## 2012-12-05 DIAGNOSIS — I509 Heart failure, unspecified: Secondary | ICD-10-CM | POA: Diagnosis not present

## 2012-12-05 DIAGNOSIS — I4891 Unspecified atrial fibrillation: Secondary | ICD-10-CM | POA: Diagnosis not present

## 2012-12-05 DIAGNOSIS — R32 Unspecified urinary incontinence: Secondary | ICD-10-CM | POA: Diagnosis not present

## 2012-12-05 DIAGNOSIS — F0390 Unspecified dementia without behavioral disturbance: Secondary | ICD-10-CM | POA: Diagnosis not present

## 2012-12-05 DIAGNOSIS — I10 Essential (primary) hypertension: Secondary | ICD-10-CM | POA: Diagnosis not present

## 2012-12-05 DIAGNOSIS — I5032 Chronic diastolic (congestive) heart failure: Secondary | ICD-10-CM | POA: Diagnosis not present

## 2012-12-10 DIAGNOSIS — I509 Heart failure, unspecified: Secondary | ICD-10-CM | POA: Diagnosis not present

## 2012-12-10 DIAGNOSIS — R32 Unspecified urinary incontinence: Secondary | ICD-10-CM | POA: Diagnosis not present

## 2012-12-10 DIAGNOSIS — F0390 Unspecified dementia without behavioral disturbance: Secondary | ICD-10-CM | POA: Diagnosis not present

## 2012-12-10 DIAGNOSIS — I5032 Chronic diastolic (congestive) heart failure: Secondary | ICD-10-CM | POA: Diagnosis not present

## 2012-12-10 DIAGNOSIS — I10 Essential (primary) hypertension: Secondary | ICD-10-CM | POA: Diagnosis not present

## 2012-12-10 DIAGNOSIS — I4891 Unspecified atrial fibrillation: Secondary | ICD-10-CM | POA: Diagnosis not present

## 2012-12-18 DIAGNOSIS — I509 Heart failure, unspecified: Secondary | ICD-10-CM | POA: Diagnosis not present

## 2012-12-18 DIAGNOSIS — R32 Unspecified urinary incontinence: Secondary | ICD-10-CM | POA: Diagnosis not present

## 2012-12-18 DIAGNOSIS — F0390 Unspecified dementia without behavioral disturbance: Secondary | ICD-10-CM | POA: Diagnosis not present

## 2012-12-18 DIAGNOSIS — I10 Essential (primary) hypertension: Secondary | ICD-10-CM | POA: Diagnosis not present

## 2012-12-18 DIAGNOSIS — I5032 Chronic diastolic (congestive) heart failure: Secondary | ICD-10-CM | POA: Diagnosis not present

## 2012-12-18 DIAGNOSIS — I4891 Unspecified atrial fibrillation: Secondary | ICD-10-CM | POA: Diagnosis not present

## 2012-12-22 DIAGNOSIS — F29 Unspecified psychosis not due to a substance or known physiological condition: Secondary | ICD-10-CM | POA: Diagnosis not present

## 2012-12-25 DIAGNOSIS — I5032 Chronic diastolic (congestive) heart failure: Secondary | ICD-10-CM | POA: Diagnosis not present

## 2012-12-25 DIAGNOSIS — F0390 Unspecified dementia without behavioral disturbance: Secondary | ICD-10-CM | POA: Diagnosis not present

## 2012-12-25 DIAGNOSIS — R32 Unspecified urinary incontinence: Secondary | ICD-10-CM | POA: Diagnosis not present

## 2012-12-25 DIAGNOSIS — I509 Heart failure, unspecified: Secondary | ICD-10-CM | POA: Diagnosis not present

## 2012-12-25 DIAGNOSIS — I10 Essential (primary) hypertension: Secondary | ICD-10-CM | POA: Diagnosis not present

## 2012-12-25 DIAGNOSIS — I4891 Unspecified atrial fibrillation: Secondary | ICD-10-CM | POA: Diagnosis not present

## 2012-12-31 DIAGNOSIS — I4891 Unspecified atrial fibrillation: Secondary | ICD-10-CM | POA: Diagnosis not present

## 2012-12-31 DIAGNOSIS — R32 Unspecified urinary incontinence: Secondary | ICD-10-CM | POA: Diagnosis not present

## 2012-12-31 DIAGNOSIS — F0390 Unspecified dementia without behavioral disturbance: Secondary | ICD-10-CM | POA: Diagnosis not present

## 2012-12-31 DIAGNOSIS — I509 Heart failure, unspecified: Secondary | ICD-10-CM | POA: Diagnosis not present

## 2012-12-31 DIAGNOSIS — I10 Essential (primary) hypertension: Secondary | ICD-10-CM | POA: Diagnosis not present

## 2012-12-31 DIAGNOSIS — I5032 Chronic diastolic (congestive) heart failure: Secondary | ICD-10-CM | POA: Diagnosis not present

## 2013-01-05 DIAGNOSIS — F29 Unspecified psychosis not due to a substance or known physiological condition: Secondary | ICD-10-CM | POA: Diagnosis not present

## 2013-01-05 DIAGNOSIS — D649 Anemia, unspecified: Secondary | ICD-10-CM | POA: Diagnosis not present

## 2013-01-05 DIAGNOSIS — S72143A Displaced intertrochanteric fracture of unspecified femur, initial encounter for closed fracture: Secondary | ICD-10-CM | POA: Diagnosis not present

## 2013-01-05 DIAGNOSIS — T84498A Other mechanical complication of other internal orthopedic devices, implants and grafts, initial encounter: Secondary | ICD-10-CM | POA: Diagnosis not present

## 2013-01-05 DIAGNOSIS — I4891 Unspecified atrial fibrillation: Secondary | ICD-10-CM | POA: Diagnosis not present

## 2013-01-09 DIAGNOSIS — F0390 Unspecified dementia without behavioral disturbance: Secondary | ICD-10-CM | POA: Diagnosis not present

## 2013-01-09 DIAGNOSIS — I5032 Chronic diastolic (congestive) heart failure: Secondary | ICD-10-CM | POA: Diagnosis not present

## 2013-01-09 DIAGNOSIS — I4891 Unspecified atrial fibrillation: Secondary | ICD-10-CM | POA: Diagnosis not present

## 2013-01-09 DIAGNOSIS — R32 Unspecified urinary incontinence: Secondary | ICD-10-CM | POA: Diagnosis not present

## 2013-01-09 DIAGNOSIS — I10 Essential (primary) hypertension: Secondary | ICD-10-CM | POA: Diagnosis not present

## 2013-01-09 DIAGNOSIS — I509 Heart failure, unspecified: Secondary | ICD-10-CM | POA: Diagnosis not present

## 2013-01-12 DIAGNOSIS — Z7401 Bed confinement status: Secondary | ICD-10-CM | POA: Diagnosis not present

## 2013-01-12 DIAGNOSIS — K219 Gastro-esophageal reflux disease without esophagitis: Secondary | ICD-10-CM | POA: Diagnosis present

## 2013-01-12 DIAGNOSIS — F068 Other specified mental disorders due to known physiological condition: Secondary | ICD-10-CM | POA: Diagnosis not present

## 2013-01-12 DIAGNOSIS — M6281 Muscle weakness (generalized): Secondary | ICD-10-CM | POA: Diagnosis not present

## 2013-01-12 DIAGNOSIS — I10 Essential (primary) hypertension: Secondary | ICD-10-CM | POA: Diagnosis not present

## 2013-01-12 DIAGNOSIS — Z7901 Long term (current) use of anticoagulants: Secondary | ICD-10-CM | POA: Diagnosis not present

## 2013-01-12 DIAGNOSIS — T8489XA Other specified complication of internal orthopedic prosthetic devices, implants and grafts, initial encounter: Secondary | ICD-10-CM | POA: Diagnosis not present

## 2013-01-12 DIAGNOSIS — G8918 Other acute postprocedural pain: Secondary | ICD-10-CM | POA: Diagnosis not present

## 2013-01-12 DIAGNOSIS — M810 Age-related osteoporosis without current pathological fracture: Secondary | ICD-10-CM | POA: Diagnosis not present

## 2013-01-12 DIAGNOSIS — T84099A Other mechanical complication of unspecified internal joint prosthesis, initial encounter: Secondary | ICD-10-CM | POA: Diagnosis not present

## 2013-01-12 DIAGNOSIS — R269 Unspecified abnormalities of gait and mobility: Secondary | ICD-10-CM | POA: Diagnosis present

## 2013-01-12 DIAGNOSIS — E538 Deficiency of other specified B group vitamins: Secondary | ICD-10-CM | POA: Diagnosis not present

## 2013-01-12 DIAGNOSIS — M2550 Pain in unspecified joint: Secondary | ICD-10-CM | POA: Diagnosis not present

## 2013-01-12 DIAGNOSIS — I4891 Unspecified atrial fibrillation: Secondary | ICD-10-CM | POA: Diagnosis not present

## 2013-01-12 DIAGNOSIS — T84498A Other mechanical complication of other internal orthopedic devices, implants and grafts, initial encounter: Secondary | ICD-10-CM | POA: Diagnosis not present

## 2013-01-12 DIAGNOSIS — S72009A Fracture of unspecified part of neck of unspecified femur, initial encounter for closed fracture: Secondary | ICD-10-CM | POA: Diagnosis not present

## 2013-01-12 DIAGNOSIS — D649 Anemia, unspecified: Secondary | ICD-10-CM | POA: Diagnosis not present

## 2013-01-12 DIAGNOSIS — Z96649 Presence of unspecified artificial hip joint: Secondary | ICD-10-CM | POA: Diagnosis not present

## 2013-01-12 DIAGNOSIS — M25559 Pain in unspecified hip: Secondary | ICD-10-CM | POA: Diagnosis not present

## 2013-01-12 DIAGNOSIS — S72143A Displaced intertrochanteric fracture of unspecified femur, initial encounter for closed fracture: Secondary | ICD-10-CM | POA: Diagnosis not present

## 2013-01-12 DIAGNOSIS — M818 Other osteoporosis without current pathological fracture: Secondary | ICD-10-CM | POA: Diagnosis not present

## 2013-01-12 DIAGNOSIS — D62 Acute posthemorrhagic anemia: Secondary | ICD-10-CM | POA: Diagnosis not present

## 2013-01-12 DIAGNOSIS — F0280 Dementia in other diseases classified elsewhere without behavioral disturbance: Secondary | ICD-10-CM | POA: Diagnosis not present

## 2013-01-12 DIAGNOSIS — F0390 Unspecified dementia without behavioral disturbance: Secondary | ICD-10-CM | POA: Diagnosis not present

## 2013-01-12 DIAGNOSIS — E785 Hyperlipidemia, unspecified: Secondary | ICD-10-CM | POA: Diagnosis not present

## 2013-01-12 DIAGNOSIS — Z471 Aftercare following joint replacement surgery: Secondary | ICD-10-CM | POA: Diagnosis not present

## 2013-01-12 DIAGNOSIS — Z8673 Personal history of transient ischemic attack (TIA), and cerebral infarction without residual deficits: Secondary | ICD-10-CM | POA: Diagnosis not present

## 2013-01-15 DIAGNOSIS — F0280 Dementia in other diseases classified elsewhere without behavioral disturbance: Secondary | ICD-10-CM | POA: Diagnosis not present

## 2013-01-15 DIAGNOSIS — M818 Other osteoporosis without current pathological fracture: Secondary | ICD-10-CM | POA: Diagnosis not present

## 2013-01-15 DIAGNOSIS — R269 Unspecified abnormalities of gait and mobility: Secondary | ICD-10-CM | POA: Diagnosis not present

## 2013-01-15 DIAGNOSIS — I4891 Unspecified atrial fibrillation: Secondary | ICD-10-CM | POA: Diagnosis not present

## 2013-01-15 DIAGNOSIS — E785 Hyperlipidemia, unspecified: Secondary | ICD-10-CM | POA: Diagnosis not present

## 2013-01-15 DIAGNOSIS — I10 Essential (primary) hypertension: Secondary | ICD-10-CM | POA: Diagnosis not present

## 2013-01-15 DIAGNOSIS — R5381 Other malaise: Secondary | ICD-10-CM | POA: Diagnosis not present

## 2013-01-15 DIAGNOSIS — M25559 Pain in unspecified hip: Secondary | ICD-10-CM | POA: Diagnosis not present

## 2013-01-15 DIAGNOSIS — S72019A Unspecified intracapsular fracture of unspecified femur, initial encounter for closed fracture: Secondary | ICD-10-CM | POA: Diagnosis not present

## 2013-01-15 DIAGNOSIS — M2550 Pain in unspecified joint: Secondary | ICD-10-CM | POA: Diagnosis not present

## 2013-01-15 DIAGNOSIS — Z471 Aftercare following joint replacement surgery: Secondary | ICD-10-CM | POA: Diagnosis not present

## 2013-01-15 DIAGNOSIS — Z96649 Presence of unspecified artificial hip joint: Secondary | ICD-10-CM | POA: Diagnosis not present

## 2013-01-15 DIAGNOSIS — F0390 Unspecified dementia without behavioral disturbance: Secondary | ICD-10-CM | POA: Diagnosis not present

## 2013-01-15 DIAGNOSIS — M6281 Muscle weakness (generalized): Secondary | ICD-10-CM | POA: Diagnosis not present

## 2013-01-15 DIAGNOSIS — I509 Heart failure, unspecified: Secondary | ICD-10-CM | POA: Diagnosis not present

## 2013-01-15 DIAGNOSIS — S72009A Fracture of unspecified part of neck of unspecified femur, initial encounter for closed fracture: Secondary | ICD-10-CM | POA: Diagnosis not present

## 2013-01-15 DIAGNOSIS — Z7901 Long term (current) use of anticoagulants: Secondary | ICD-10-CM | POA: Diagnosis not present

## 2013-01-15 DIAGNOSIS — D62 Acute posthemorrhagic anemia: Secondary | ICD-10-CM | POA: Diagnosis not present

## 2013-01-15 DIAGNOSIS — D518 Other vitamin B12 deficiency anemias: Secondary | ICD-10-CM | POA: Diagnosis not present

## 2013-01-15 DIAGNOSIS — D510 Vitamin B12 deficiency anemia due to intrinsic factor deficiency: Secondary | ICD-10-CM | POA: Diagnosis not present

## 2013-01-15 DIAGNOSIS — Z7401 Bed confinement status: Secondary | ICD-10-CM | POA: Diagnosis not present

## 2013-01-16 DIAGNOSIS — S72019A Unspecified intracapsular fracture of unspecified femur, initial encounter for closed fracture: Secondary | ICD-10-CM | POA: Diagnosis not present

## 2013-01-16 DIAGNOSIS — I10 Essential (primary) hypertension: Secondary | ICD-10-CM | POA: Diagnosis not present

## 2013-01-16 DIAGNOSIS — I4891 Unspecified atrial fibrillation: Secondary | ICD-10-CM | POA: Diagnosis not present

## 2013-01-16 DIAGNOSIS — R5381 Other malaise: Secondary | ICD-10-CM | POA: Diagnosis not present

## 2013-02-17 DIAGNOSIS — I4891 Unspecified atrial fibrillation: Secondary | ICD-10-CM | POA: Diagnosis not present

## 2013-02-17 DIAGNOSIS — D518 Other vitamin B12 deficiency anemias: Secondary | ICD-10-CM | POA: Diagnosis not present

## 2013-02-17 DIAGNOSIS — S72009A Fracture of unspecified part of neck of unspecified femur, initial encounter for closed fracture: Secondary | ICD-10-CM | POA: Diagnosis not present

## 2013-02-17 DIAGNOSIS — I10 Essential (primary) hypertension: Secondary | ICD-10-CM | POA: Diagnosis not present

## 2013-02-21 DIAGNOSIS — I509 Heart failure, unspecified: Secondary | ICD-10-CM | POA: Diagnosis not present

## 2013-02-21 DIAGNOSIS — Z7901 Long term (current) use of anticoagulants: Secondary | ICD-10-CM | POA: Diagnosis not present

## 2013-02-21 DIAGNOSIS — Z8673 Personal history of transient ischemic attack (TIA), and cerebral infarction without residual deficits: Secondary | ICD-10-CM | POA: Diagnosis not present

## 2013-02-21 DIAGNOSIS — I10 Essential (primary) hypertension: Secondary | ICD-10-CM | POA: Diagnosis not present

## 2013-02-21 DIAGNOSIS — Z471 Aftercare following joint replacement surgery: Secondary | ICD-10-CM | POA: Diagnosis not present

## 2013-02-21 DIAGNOSIS — D510 Vitamin B12 deficiency anemia due to intrinsic factor deficiency: Secondary | ICD-10-CM | POA: Diagnosis not present

## 2013-02-21 DIAGNOSIS — M1990 Unspecified osteoarthritis, unspecified site: Secondary | ICD-10-CM | POA: Diagnosis not present

## 2013-02-21 DIAGNOSIS — M810 Age-related osteoporosis without current pathological fracture: Secondary | ICD-10-CM | POA: Diagnosis not present

## 2013-02-21 DIAGNOSIS — Z96649 Presence of unspecified artificial hip joint: Secondary | ICD-10-CM | POA: Diagnosis not present

## 2013-02-21 DIAGNOSIS — I4891 Unspecified atrial fibrillation: Secondary | ICD-10-CM | POA: Diagnosis not present

## 2013-02-23 DIAGNOSIS — D510 Vitamin B12 deficiency anemia due to intrinsic factor deficiency: Secondary | ICD-10-CM | POA: Diagnosis not present

## 2013-02-23 DIAGNOSIS — I10 Essential (primary) hypertension: Secondary | ICD-10-CM | POA: Diagnosis not present

## 2013-02-23 DIAGNOSIS — I4891 Unspecified atrial fibrillation: Secondary | ICD-10-CM | POA: Diagnosis not present

## 2013-02-23 DIAGNOSIS — I509 Heart failure, unspecified: Secondary | ICD-10-CM | POA: Diagnosis not present

## 2013-02-23 DIAGNOSIS — Z471 Aftercare following joint replacement surgery: Secondary | ICD-10-CM | POA: Diagnosis not present

## 2013-02-23 DIAGNOSIS — M810 Age-related osteoporosis without current pathological fracture: Secondary | ICD-10-CM | POA: Diagnosis not present

## 2013-02-24 DIAGNOSIS — D510 Vitamin B12 deficiency anemia due to intrinsic factor deficiency: Secondary | ICD-10-CM | POA: Diagnosis not present

## 2013-02-24 DIAGNOSIS — Z471 Aftercare following joint replacement surgery: Secondary | ICD-10-CM | POA: Diagnosis not present

## 2013-02-24 DIAGNOSIS — M810 Age-related osteoporosis without current pathological fracture: Secondary | ICD-10-CM | POA: Diagnosis not present

## 2013-02-24 DIAGNOSIS — I10 Essential (primary) hypertension: Secondary | ICD-10-CM | POA: Diagnosis not present

## 2013-02-24 DIAGNOSIS — I4891 Unspecified atrial fibrillation: Secondary | ICD-10-CM | POA: Diagnosis not present

## 2013-02-24 DIAGNOSIS — I509 Heart failure, unspecified: Secondary | ICD-10-CM | POA: Diagnosis not present

## 2013-02-26 DIAGNOSIS — Z471 Aftercare following joint replacement surgery: Secondary | ICD-10-CM | POA: Diagnosis not present

## 2013-02-26 DIAGNOSIS — I10 Essential (primary) hypertension: Secondary | ICD-10-CM | POA: Diagnosis not present

## 2013-02-26 DIAGNOSIS — D510 Vitamin B12 deficiency anemia due to intrinsic factor deficiency: Secondary | ICD-10-CM | POA: Diagnosis not present

## 2013-02-26 DIAGNOSIS — I509 Heart failure, unspecified: Secondary | ICD-10-CM | POA: Diagnosis not present

## 2013-02-26 DIAGNOSIS — I4891 Unspecified atrial fibrillation: Secondary | ICD-10-CM | POA: Diagnosis not present

## 2013-02-26 DIAGNOSIS — M810 Age-related osteoporosis without current pathological fracture: Secondary | ICD-10-CM | POA: Diagnosis not present

## 2013-02-27 DIAGNOSIS — I10 Essential (primary) hypertension: Secondary | ICD-10-CM | POA: Diagnosis not present

## 2013-02-27 DIAGNOSIS — Z471 Aftercare following joint replacement surgery: Secondary | ICD-10-CM | POA: Diagnosis not present

## 2013-02-27 DIAGNOSIS — D510 Vitamin B12 deficiency anemia due to intrinsic factor deficiency: Secondary | ICD-10-CM | POA: Diagnosis not present

## 2013-02-27 DIAGNOSIS — M810 Age-related osteoporosis without current pathological fracture: Secondary | ICD-10-CM | POA: Diagnosis not present

## 2013-02-27 DIAGNOSIS — I4891 Unspecified atrial fibrillation: Secondary | ICD-10-CM | POA: Diagnosis not present

## 2013-02-27 DIAGNOSIS — I509 Heart failure, unspecified: Secondary | ICD-10-CM | POA: Diagnosis not present

## 2013-02-28 DIAGNOSIS — I4891 Unspecified atrial fibrillation: Secondary | ICD-10-CM | POA: Diagnosis not present

## 2013-02-28 DIAGNOSIS — Z471 Aftercare following joint replacement surgery: Secondary | ICD-10-CM | POA: Diagnosis not present

## 2013-02-28 DIAGNOSIS — M810 Age-related osteoporosis without current pathological fracture: Secondary | ICD-10-CM | POA: Diagnosis not present

## 2013-02-28 DIAGNOSIS — I10 Essential (primary) hypertension: Secondary | ICD-10-CM | POA: Diagnosis not present

## 2013-02-28 DIAGNOSIS — I509 Heart failure, unspecified: Secondary | ICD-10-CM | POA: Diagnosis not present

## 2013-02-28 DIAGNOSIS — D510 Vitamin B12 deficiency anemia due to intrinsic factor deficiency: Secondary | ICD-10-CM | POA: Diagnosis not present

## 2013-03-02 DIAGNOSIS — D510 Vitamin B12 deficiency anemia due to intrinsic factor deficiency: Secondary | ICD-10-CM | POA: Diagnosis not present

## 2013-03-02 DIAGNOSIS — Z471 Aftercare following joint replacement surgery: Secondary | ICD-10-CM | POA: Diagnosis not present

## 2013-03-02 DIAGNOSIS — I4891 Unspecified atrial fibrillation: Secondary | ICD-10-CM | POA: Diagnosis not present

## 2013-03-02 DIAGNOSIS — M810 Age-related osteoporosis without current pathological fracture: Secondary | ICD-10-CM | POA: Diagnosis not present

## 2013-03-02 DIAGNOSIS — I509 Heart failure, unspecified: Secondary | ICD-10-CM | POA: Diagnosis not present

## 2013-03-02 DIAGNOSIS — I10 Essential (primary) hypertension: Secondary | ICD-10-CM | POA: Diagnosis not present

## 2013-03-03 DIAGNOSIS — I509 Heart failure, unspecified: Secondary | ICD-10-CM | POA: Diagnosis not present

## 2013-03-03 DIAGNOSIS — M810 Age-related osteoporosis without current pathological fracture: Secondary | ICD-10-CM | POA: Diagnosis not present

## 2013-03-03 DIAGNOSIS — Z471 Aftercare following joint replacement surgery: Secondary | ICD-10-CM | POA: Diagnosis not present

## 2013-03-03 DIAGNOSIS — I4891 Unspecified atrial fibrillation: Secondary | ICD-10-CM | POA: Diagnosis not present

## 2013-03-03 DIAGNOSIS — I10 Essential (primary) hypertension: Secondary | ICD-10-CM | POA: Diagnosis not present

## 2013-03-03 DIAGNOSIS — D510 Vitamin B12 deficiency anemia due to intrinsic factor deficiency: Secondary | ICD-10-CM | POA: Diagnosis not present

## 2013-03-04 DIAGNOSIS — I4891 Unspecified atrial fibrillation: Secondary | ICD-10-CM | POA: Diagnosis not present

## 2013-03-04 DIAGNOSIS — M810 Age-related osteoporosis without current pathological fracture: Secondary | ICD-10-CM | POA: Diagnosis not present

## 2013-03-04 DIAGNOSIS — D510 Vitamin B12 deficiency anemia due to intrinsic factor deficiency: Secondary | ICD-10-CM | POA: Diagnosis not present

## 2013-03-04 DIAGNOSIS — Z471 Aftercare following joint replacement surgery: Secondary | ICD-10-CM | POA: Diagnosis not present

## 2013-03-04 DIAGNOSIS — I509 Heart failure, unspecified: Secondary | ICD-10-CM | POA: Diagnosis not present

## 2013-03-04 DIAGNOSIS — I10 Essential (primary) hypertension: Secondary | ICD-10-CM | POA: Diagnosis not present

## 2013-03-05 DIAGNOSIS — Z471 Aftercare following joint replacement surgery: Secondary | ICD-10-CM | POA: Diagnosis not present

## 2013-03-05 DIAGNOSIS — I509 Heart failure, unspecified: Secondary | ICD-10-CM | POA: Diagnosis not present

## 2013-03-05 DIAGNOSIS — I4891 Unspecified atrial fibrillation: Secondary | ICD-10-CM | POA: Diagnosis not present

## 2013-03-05 DIAGNOSIS — M810 Age-related osteoporosis without current pathological fracture: Secondary | ICD-10-CM | POA: Diagnosis not present

## 2013-03-05 DIAGNOSIS — I10 Essential (primary) hypertension: Secondary | ICD-10-CM | POA: Diagnosis not present

## 2013-03-05 DIAGNOSIS — D510 Vitamin B12 deficiency anemia due to intrinsic factor deficiency: Secondary | ICD-10-CM | POA: Diagnosis not present

## 2013-03-10 DIAGNOSIS — D510 Vitamin B12 deficiency anemia due to intrinsic factor deficiency: Secondary | ICD-10-CM | POA: Diagnosis not present

## 2013-03-10 DIAGNOSIS — M810 Age-related osteoporosis without current pathological fracture: Secondary | ICD-10-CM | POA: Diagnosis not present

## 2013-03-10 DIAGNOSIS — I10 Essential (primary) hypertension: Secondary | ICD-10-CM | POA: Diagnosis not present

## 2013-03-10 DIAGNOSIS — Z471 Aftercare following joint replacement surgery: Secondary | ICD-10-CM | POA: Diagnosis not present

## 2013-03-10 DIAGNOSIS — I4891 Unspecified atrial fibrillation: Secondary | ICD-10-CM | POA: Diagnosis not present

## 2013-03-10 DIAGNOSIS — I509 Heart failure, unspecified: Secondary | ICD-10-CM | POA: Diagnosis not present

## 2013-03-11 DIAGNOSIS — D518 Other vitamin B12 deficiency anemias: Secondary | ICD-10-CM | POA: Diagnosis not present

## 2013-03-11 DIAGNOSIS — I10 Essential (primary) hypertension: Secondary | ICD-10-CM | POA: Diagnosis not present

## 2013-03-11 DIAGNOSIS — S72009A Fracture of unspecified part of neck of unspecified femur, initial encounter for closed fracture: Secondary | ICD-10-CM | POA: Diagnosis not present

## 2013-03-11 DIAGNOSIS — I4891 Unspecified atrial fibrillation: Secondary | ICD-10-CM | POA: Diagnosis not present

## 2013-03-12 DIAGNOSIS — I4891 Unspecified atrial fibrillation: Secondary | ICD-10-CM | POA: Diagnosis not present

## 2013-03-12 DIAGNOSIS — Z471 Aftercare following joint replacement surgery: Secondary | ICD-10-CM | POA: Diagnosis not present

## 2013-03-12 DIAGNOSIS — M810 Age-related osteoporosis without current pathological fracture: Secondary | ICD-10-CM | POA: Diagnosis not present

## 2013-03-12 DIAGNOSIS — I10 Essential (primary) hypertension: Secondary | ICD-10-CM | POA: Diagnosis not present

## 2013-03-12 DIAGNOSIS — I509 Heart failure, unspecified: Secondary | ICD-10-CM | POA: Diagnosis not present

## 2013-03-12 DIAGNOSIS — D510 Vitamin B12 deficiency anemia due to intrinsic factor deficiency: Secondary | ICD-10-CM | POA: Diagnosis not present

## 2013-03-13 DIAGNOSIS — M810 Age-related osteoporosis without current pathological fracture: Secondary | ICD-10-CM | POA: Diagnosis not present

## 2013-03-13 DIAGNOSIS — I4891 Unspecified atrial fibrillation: Secondary | ICD-10-CM | POA: Diagnosis not present

## 2013-03-13 DIAGNOSIS — I509 Heart failure, unspecified: Secondary | ICD-10-CM | POA: Diagnosis not present

## 2013-03-13 DIAGNOSIS — Z471 Aftercare following joint replacement surgery: Secondary | ICD-10-CM | POA: Diagnosis not present

## 2013-03-13 DIAGNOSIS — I10 Essential (primary) hypertension: Secondary | ICD-10-CM | POA: Diagnosis not present

## 2013-03-13 DIAGNOSIS — D510 Vitamin B12 deficiency anemia due to intrinsic factor deficiency: Secondary | ICD-10-CM | POA: Diagnosis not present

## 2013-03-18 DIAGNOSIS — I10 Essential (primary) hypertension: Secondary | ICD-10-CM | POA: Diagnosis not present

## 2013-03-18 DIAGNOSIS — D510 Vitamin B12 deficiency anemia due to intrinsic factor deficiency: Secondary | ICD-10-CM | POA: Diagnosis not present

## 2013-03-18 DIAGNOSIS — Z471 Aftercare following joint replacement surgery: Secondary | ICD-10-CM | POA: Diagnosis not present

## 2013-03-18 DIAGNOSIS — I4891 Unspecified atrial fibrillation: Secondary | ICD-10-CM | POA: Diagnosis not present

## 2013-03-18 DIAGNOSIS — M810 Age-related osteoporosis without current pathological fracture: Secondary | ICD-10-CM | POA: Diagnosis not present

## 2013-03-18 DIAGNOSIS — I509 Heart failure, unspecified: Secondary | ICD-10-CM | POA: Diagnosis not present

## 2013-03-19 DIAGNOSIS — I10 Essential (primary) hypertension: Secondary | ICD-10-CM | POA: Diagnosis not present

## 2013-03-19 DIAGNOSIS — Z471 Aftercare following joint replacement surgery: Secondary | ICD-10-CM | POA: Diagnosis not present

## 2013-03-19 DIAGNOSIS — D510 Vitamin B12 deficiency anemia due to intrinsic factor deficiency: Secondary | ICD-10-CM | POA: Diagnosis not present

## 2013-03-19 DIAGNOSIS — I509 Heart failure, unspecified: Secondary | ICD-10-CM | POA: Diagnosis not present

## 2013-03-19 DIAGNOSIS — M810 Age-related osteoporosis without current pathological fracture: Secondary | ICD-10-CM | POA: Diagnosis not present

## 2013-03-19 DIAGNOSIS — I4891 Unspecified atrial fibrillation: Secondary | ICD-10-CM | POA: Diagnosis not present

## 2013-03-20 DIAGNOSIS — M810 Age-related osteoporosis without current pathological fracture: Secondary | ICD-10-CM | POA: Diagnosis not present

## 2013-03-20 DIAGNOSIS — I509 Heart failure, unspecified: Secondary | ICD-10-CM | POA: Diagnosis not present

## 2013-03-20 DIAGNOSIS — I10 Essential (primary) hypertension: Secondary | ICD-10-CM | POA: Diagnosis not present

## 2013-03-20 DIAGNOSIS — Z471 Aftercare following joint replacement surgery: Secondary | ICD-10-CM | POA: Diagnosis not present

## 2013-03-20 DIAGNOSIS — I4891 Unspecified atrial fibrillation: Secondary | ICD-10-CM | POA: Diagnosis not present

## 2013-03-20 DIAGNOSIS — D510 Vitamin B12 deficiency anemia due to intrinsic factor deficiency: Secondary | ICD-10-CM | POA: Diagnosis not present

## 2013-03-24 DIAGNOSIS — D510 Vitamin B12 deficiency anemia due to intrinsic factor deficiency: Secondary | ICD-10-CM | POA: Diagnosis not present

## 2013-03-24 DIAGNOSIS — I509 Heart failure, unspecified: Secondary | ICD-10-CM | POA: Diagnosis not present

## 2013-03-24 DIAGNOSIS — Z471 Aftercare following joint replacement surgery: Secondary | ICD-10-CM | POA: Diagnosis not present

## 2013-03-24 DIAGNOSIS — M810 Age-related osteoporosis without current pathological fracture: Secondary | ICD-10-CM | POA: Diagnosis not present

## 2013-03-24 DIAGNOSIS — I10 Essential (primary) hypertension: Secondary | ICD-10-CM | POA: Diagnosis not present

## 2013-03-24 DIAGNOSIS — I4891 Unspecified atrial fibrillation: Secondary | ICD-10-CM | POA: Diagnosis not present

## 2013-03-26 DIAGNOSIS — I10 Essential (primary) hypertension: Secondary | ICD-10-CM | POA: Diagnosis not present

## 2013-03-26 DIAGNOSIS — I4891 Unspecified atrial fibrillation: Secondary | ICD-10-CM | POA: Diagnosis not present

## 2013-03-26 DIAGNOSIS — I509 Heart failure, unspecified: Secondary | ICD-10-CM | POA: Diagnosis not present

## 2013-03-26 DIAGNOSIS — M810 Age-related osteoporosis without current pathological fracture: Secondary | ICD-10-CM | POA: Diagnosis not present

## 2013-03-26 DIAGNOSIS — D510 Vitamin B12 deficiency anemia due to intrinsic factor deficiency: Secondary | ICD-10-CM | POA: Diagnosis not present

## 2013-03-26 DIAGNOSIS — Z471 Aftercare following joint replacement surgery: Secondary | ICD-10-CM | POA: Diagnosis not present

## 2013-04-02 DIAGNOSIS — I10 Essential (primary) hypertension: Secondary | ICD-10-CM | POA: Diagnosis not present

## 2013-04-02 DIAGNOSIS — M810 Age-related osteoporosis without current pathological fracture: Secondary | ICD-10-CM | POA: Diagnosis not present

## 2013-04-02 DIAGNOSIS — D510 Vitamin B12 deficiency anemia due to intrinsic factor deficiency: Secondary | ICD-10-CM | POA: Diagnosis not present

## 2013-04-02 DIAGNOSIS — I509 Heart failure, unspecified: Secondary | ICD-10-CM | POA: Diagnosis not present

## 2013-04-02 DIAGNOSIS — I4891 Unspecified atrial fibrillation: Secondary | ICD-10-CM | POA: Diagnosis not present

## 2013-04-02 DIAGNOSIS — Z471 Aftercare following joint replacement surgery: Secondary | ICD-10-CM | POA: Diagnosis not present

## 2013-04-04 DIAGNOSIS — M810 Age-related osteoporosis without current pathological fracture: Secondary | ICD-10-CM | POA: Diagnosis not present

## 2013-04-04 DIAGNOSIS — D510 Vitamin B12 deficiency anemia due to intrinsic factor deficiency: Secondary | ICD-10-CM | POA: Diagnosis not present

## 2013-04-04 DIAGNOSIS — I509 Heart failure, unspecified: Secondary | ICD-10-CM | POA: Diagnosis not present

## 2013-04-04 DIAGNOSIS — Z471 Aftercare following joint replacement surgery: Secondary | ICD-10-CM | POA: Diagnosis not present

## 2013-04-04 DIAGNOSIS — I4891 Unspecified atrial fibrillation: Secondary | ICD-10-CM | POA: Diagnosis not present

## 2013-04-04 DIAGNOSIS — I10 Essential (primary) hypertension: Secondary | ICD-10-CM | POA: Diagnosis not present

## 2013-04-07 DIAGNOSIS — Z471 Aftercare following joint replacement surgery: Secondary | ICD-10-CM | POA: Diagnosis not present

## 2013-04-07 DIAGNOSIS — I10 Essential (primary) hypertension: Secondary | ICD-10-CM | POA: Diagnosis not present

## 2013-04-07 DIAGNOSIS — I509 Heart failure, unspecified: Secondary | ICD-10-CM | POA: Diagnosis not present

## 2013-04-07 DIAGNOSIS — I4891 Unspecified atrial fibrillation: Secondary | ICD-10-CM | POA: Diagnosis not present

## 2013-04-07 DIAGNOSIS — M810 Age-related osteoporosis without current pathological fracture: Secondary | ICD-10-CM | POA: Diagnosis not present

## 2013-04-07 DIAGNOSIS — D510 Vitamin B12 deficiency anemia due to intrinsic factor deficiency: Secondary | ICD-10-CM | POA: Diagnosis not present

## 2013-04-09 DIAGNOSIS — I10 Essential (primary) hypertension: Secondary | ICD-10-CM | POA: Diagnosis not present

## 2013-04-09 DIAGNOSIS — I4891 Unspecified atrial fibrillation: Secondary | ICD-10-CM | POA: Diagnosis not present

## 2013-04-09 DIAGNOSIS — I509 Heart failure, unspecified: Secondary | ICD-10-CM | POA: Diagnosis not present

## 2013-04-09 DIAGNOSIS — D510 Vitamin B12 deficiency anemia due to intrinsic factor deficiency: Secondary | ICD-10-CM | POA: Diagnosis not present

## 2013-04-09 DIAGNOSIS — Z471 Aftercare following joint replacement surgery: Secondary | ICD-10-CM | POA: Diagnosis not present

## 2013-04-09 DIAGNOSIS — M810 Age-related osteoporosis without current pathological fracture: Secondary | ICD-10-CM | POA: Diagnosis not present

## 2013-04-14 DIAGNOSIS — I10 Essential (primary) hypertension: Secondary | ICD-10-CM | POA: Diagnosis not present

## 2013-04-14 DIAGNOSIS — D510 Vitamin B12 deficiency anemia due to intrinsic factor deficiency: Secondary | ICD-10-CM | POA: Diagnosis not present

## 2013-04-14 DIAGNOSIS — I4891 Unspecified atrial fibrillation: Secondary | ICD-10-CM | POA: Diagnosis not present

## 2013-04-14 DIAGNOSIS — Z471 Aftercare following joint replacement surgery: Secondary | ICD-10-CM | POA: Diagnosis not present

## 2013-04-14 DIAGNOSIS — I509 Heart failure, unspecified: Secondary | ICD-10-CM | POA: Diagnosis not present

## 2013-04-14 DIAGNOSIS — M810 Age-related osteoporosis without current pathological fracture: Secondary | ICD-10-CM | POA: Diagnosis not present

## 2013-04-16 DIAGNOSIS — I4891 Unspecified atrial fibrillation: Secondary | ICD-10-CM | POA: Diagnosis not present

## 2013-04-16 DIAGNOSIS — Z471 Aftercare following joint replacement surgery: Secondary | ICD-10-CM | POA: Diagnosis not present

## 2013-04-16 DIAGNOSIS — I10 Essential (primary) hypertension: Secondary | ICD-10-CM | POA: Diagnosis not present

## 2013-04-16 DIAGNOSIS — I509 Heart failure, unspecified: Secondary | ICD-10-CM | POA: Diagnosis not present

## 2013-04-16 DIAGNOSIS — D510 Vitamin B12 deficiency anemia due to intrinsic factor deficiency: Secondary | ICD-10-CM | POA: Diagnosis not present

## 2013-04-16 DIAGNOSIS — M810 Age-related osteoporosis without current pathological fracture: Secondary | ICD-10-CM | POA: Diagnosis not present

## 2013-04-17 DIAGNOSIS — I10 Essential (primary) hypertension: Secondary | ICD-10-CM | POA: Diagnosis not present

## 2013-04-17 DIAGNOSIS — I509 Heart failure, unspecified: Secondary | ICD-10-CM | POA: Diagnosis not present

## 2013-04-17 DIAGNOSIS — M810 Age-related osteoporosis without current pathological fracture: Secondary | ICD-10-CM | POA: Diagnosis not present

## 2013-04-17 DIAGNOSIS — D510 Vitamin B12 deficiency anemia due to intrinsic factor deficiency: Secondary | ICD-10-CM | POA: Diagnosis not present

## 2013-04-17 DIAGNOSIS — Z471 Aftercare following joint replacement surgery: Secondary | ICD-10-CM | POA: Diagnosis not present

## 2013-04-17 DIAGNOSIS — I4891 Unspecified atrial fibrillation: Secondary | ICD-10-CM | POA: Diagnosis not present

## 2013-04-21 DIAGNOSIS — Z23 Encounter for immunization: Secondary | ICD-10-CM | POA: Diagnosis not present

## 2013-04-21 DIAGNOSIS — M1610 Unilateral primary osteoarthritis, unspecified hip: Secondary | ICD-10-CM | POA: Diagnosis not present

## 2013-04-21 DIAGNOSIS — Z96659 Presence of unspecified artificial knee joint: Secondary | ICD-10-CM | POA: Diagnosis not present

## 2013-04-21 DIAGNOSIS — Z96649 Presence of unspecified artificial hip joint: Secondary | ICD-10-CM | POA: Diagnosis not present

## 2013-04-22 DIAGNOSIS — I4891 Unspecified atrial fibrillation: Secondary | ICD-10-CM | POA: Diagnosis not present

## 2013-04-22 DIAGNOSIS — Z8673 Personal history of transient ischemic attack (TIA), and cerebral infarction without residual deficits: Secondary | ICD-10-CM | POA: Diagnosis not present

## 2013-04-22 DIAGNOSIS — Z5181 Encounter for therapeutic drug level monitoring: Secondary | ICD-10-CM | POA: Diagnosis not present

## 2013-04-22 DIAGNOSIS — I509 Heart failure, unspecified: Secondary | ICD-10-CM | POA: Diagnosis not present

## 2013-04-22 DIAGNOSIS — Z7901 Long term (current) use of anticoagulants: Secondary | ICD-10-CM | POA: Diagnosis not present

## 2013-04-22 DIAGNOSIS — M810 Age-related osteoporosis without current pathological fracture: Secondary | ICD-10-CM | POA: Diagnosis not present

## 2013-04-22 DIAGNOSIS — D510 Vitamin B12 deficiency anemia due to intrinsic factor deficiency: Secondary | ICD-10-CM | POA: Diagnosis not present

## 2013-04-22 DIAGNOSIS — I10 Essential (primary) hypertension: Secondary | ICD-10-CM | POA: Diagnosis not present

## 2013-04-22 DIAGNOSIS — Z9181 History of falling: Secondary | ICD-10-CM | POA: Diagnosis not present

## 2013-04-22 DIAGNOSIS — M1990 Unspecified osteoarthritis, unspecified site: Secondary | ICD-10-CM | POA: Diagnosis not present

## 2013-04-22 DIAGNOSIS — Z96649 Presence of unspecified artificial hip joint: Secondary | ICD-10-CM | POA: Diagnosis not present

## 2013-04-27 DIAGNOSIS — M1990 Unspecified osteoarthritis, unspecified site: Secondary | ICD-10-CM | POA: Diagnosis not present

## 2013-04-27 DIAGNOSIS — I4891 Unspecified atrial fibrillation: Secondary | ICD-10-CM | POA: Diagnosis not present

## 2013-04-27 DIAGNOSIS — M810 Age-related osteoporosis without current pathological fracture: Secondary | ICD-10-CM | POA: Diagnosis not present

## 2013-04-27 DIAGNOSIS — I10 Essential (primary) hypertension: Secondary | ICD-10-CM | POA: Diagnosis not present

## 2013-04-27 DIAGNOSIS — I509 Heart failure, unspecified: Secondary | ICD-10-CM | POA: Diagnosis not present

## 2013-04-27 DIAGNOSIS — D510 Vitamin B12 deficiency anemia due to intrinsic factor deficiency: Secondary | ICD-10-CM | POA: Diagnosis not present

## 2013-05-01 DIAGNOSIS — I509 Heart failure, unspecified: Secondary | ICD-10-CM | POA: Diagnosis not present

## 2013-05-01 DIAGNOSIS — I4891 Unspecified atrial fibrillation: Secondary | ICD-10-CM | POA: Diagnosis not present

## 2013-05-01 DIAGNOSIS — D510 Vitamin B12 deficiency anemia due to intrinsic factor deficiency: Secondary | ICD-10-CM | POA: Diagnosis not present

## 2013-05-01 DIAGNOSIS — M1990 Unspecified osteoarthritis, unspecified site: Secondary | ICD-10-CM | POA: Diagnosis not present

## 2013-05-01 DIAGNOSIS — M810 Age-related osteoporosis without current pathological fracture: Secondary | ICD-10-CM | POA: Diagnosis not present

## 2013-05-01 DIAGNOSIS — I10 Essential (primary) hypertension: Secondary | ICD-10-CM | POA: Diagnosis not present

## 2013-05-08 DIAGNOSIS — I4891 Unspecified atrial fibrillation: Secondary | ICD-10-CM | POA: Diagnosis not present

## 2013-05-08 DIAGNOSIS — I509 Heart failure, unspecified: Secondary | ICD-10-CM | POA: Diagnosis not present

## 2013-05-08 DIAGNOSIS — M810 Age-related osteoporosis without current pathological fracture: Secondary | ICD-10-CM | POA: Diagnosis not present

## 2013-05-08 DIAGNOSIS — M1990 Unspecified osteoarthritis, unspecified site: Secondary | ICD-10-CM | POA: Diagnosis not present

## 2013-05-08 DIAGNOSIS — I10 Essential (primary) hypertension: Secondary | ICD-10-CM | POA: Diagnosis not present

## 2013-05-08 DIAGNOSIS — D510 Vitamin B12 deficiency anemia due to intrinsic factor deficiency: Secondary | ICD-10-CM | POA: Diagnosis not present

## 2013-05-15 DIAGNOSIS — I10 Essential (primary) hypertension: Secondary | ICD-10-CM | POA: Diagnosis not present

## 2013-05-15 DIAGNOSIS — I509 Heart failure, unspecified: Secondary | ICD-10-CM | POA: Diagnosis not present

## 2013-05-15 DIAGNOSIS — M1990 Unspecified osteoarthritis, unspecified site: Secondary | ICD-10-CM | POA: Diagnosis not present

## 2013-05-15 DIAGNOSIS — D510 Vitamin B12 deficiency anemia due to intrinsic factor deficiency: Secondary | ICD-10-CM | POA: Diagnosis not present

## 2013-05-15 DIAGNOSIS — I4891 Unspecified atrial fibrillation: Secondary | ICD-10-CM | POA: Diagnosis not present

## 2013-05-15 DIAGNOSIS — M810 Age-related osteoporosis without current pathological fracture: Secondary | ICD-10-CM | POA: Diagnosis not present

## 2013-05-19 DIAGNOSIS — I4891 Unspecified atrial fibrillation: Secondary | ICD-10-CM | POA: Diagnosis not present

## 2013-05-19 DIAGNOSIS — R0609 Other forms of dyspnea: Secondary | ICD-10-CM | POA: Diagnosis not present

## 2013-05-19 DIAGNOSIS — J90 Pleural effusion, not elsewhere classified: Secondary | ICD-10-CM | POA: Diagnosis not present

## 2013-05-19 DIAGNOSIS — R918 Other nonspecific abnormal finding of lung field: Secondary | ICD-10-CM | POA: Diagnosis not present

## 2013-05-20 DIAGNOSIS — M810 Age-related osteoporosis without current pathological fracture: Secondary | ICD-10-CM | POA: Diagnosis not present

## 2013-05-20 DIAGNOSIS — I4891 Unspecified atrial fibrillation: Secondary | ICD-10-CM | POA: Diagnosis not present

## 2013-05-20 DIAGNOSIS — D510 Vitamin B12 deficiency anemia due to intrinsic factor deficiency: Secondary | ICD-10-CM | POA: Diagnosis not present

## 2013-05-20 DIAGNOSIS — M1990 Unspecified osteoarthritis, unspecified site: Secondary | ICD-10-CM | POA: Diagnosis not present

## 2013-05-20 DIAGNOSIS — I509 Heart failure, unspecified: Secondary | ICD-10-CM | POA: Diagnosis not present

## 2013-05-20 DIAGNOSIS — I10 Essential (primary) hypertension: Secondary | ICD-10-CM | POA: Diagnosis not present

## 2013-05-22 DIAGNOSIS — IMO0002 Reserved for concepts with insufficient information to code with codable children: Secondary | ICD-10-CM | POA: Diagnosis not present

## 2013-05-22 DIAGNOSIS — Z1389 Encounter for screening for other disorder: Secondary | ICD-10-CM | POA: Diagnosis not present

## 2013-05-22 DIAGNOSIS — I509 Heart failure, unspecified: Secondary | ICD-10-CM | POA: Diagnosis not present

## 2013-05-27 DIAGNOSIS — M1990 Unspecified osteoarthritis, unspecified site: Secondary | ICD-10-CM | POA: Diagnosis not present

## 2013-05-27 DIAGNOSIS — I10 Essential (primary) hypertension: Secondary | ICD-10-CM | POA: Diagnosis not present

## 2013-05-27 DIAGNOSIS — D510 Vitamin B12 deficiency anemia due to intrinsic factor deficiency: Secondary | ICD-10-CM | POA: Diagnosis not present

## 2013-05-27 DIAGNOSIS — I509 Heart failure, unspecified: Secondary | ICD-10-CM | POA: Diagnosis not present

## 2013-05-27 DIAGNOSIS — M810 Age-related osteoporosis without current pathological fracture: Secondary | ICD-10-CM | POA: Diagnosis not present

## 2013-05-27 DIAGNOSIS — I4891 Unspecified atrial fibrillation: Secondary | ICD-10-CM | POA: Diagnosis not present

## 2013-06-02 DIAGNOSIS — M810 Age-related osteoporosis without current pathological fracture: Secondary | ICD-10-CM | POA: Diagnosis not present

## 2013-06-02 DIAGNOSIS — D510 Vitamin B12 deficiency anemia due to intrinsic factor deficiency: Secondary | ICD-10-CM | POA: Diagnosis not present

## 2013-06-02 DIAGNOSIS — M1990 Unspecified osteoarthritis, unspecified site: Secondary | ICD-10-CM | POA: Diagnosis not present

## 2013-06-02 DIAGNOSIS — I4891 Unspecified atrial fibrillation: Secondary | ICD-10-CM | POA: Diagnosis not present

## 2013-06-02 DIAGNOSIS — I509 Heart failure, unspecified: Secondary | ICD-10-CM | POA: Diagnosis not present

## 2013-06-02 DIAGNOSIS — I10 Essential (primary) hypertension: Secondary | ICD-10-CM | POA: Diagnosis not present

## 2013-06-09 DIAGNOSIS — D510 Vitamin B12 deficiency anemia due to intrinsic factor deficiency: Secondary | ICD-10-CM | POA: Diagnosis not present

## 2013-06-09 DIAGNOSIS — M810 Age-related osteoporosis without current pathological fracture: Secondary | ICD-10-CM | POA: Diagnosis not present

## 2013-06-09 DIAGNOSIS — I10 Essential (primary) hypertension: Secondary | ICD-10-CM | POA: Diagnosis not present

## 2013-06-09 DIAGNOSIS — I4891 Unspecified atrial fibrillation: Secondary | ICD-10-CM | POA: Diagnosis not present

## 2013-06-09 DIAGNOSIS — I509 Heart failure, unspecified: Secondary | ICD-10-CM | POA: Diagnosis not present

## 2013-06-09 DIAGNOSIS — M1990 Unspecified osteoarthritis, unspecified site: Secondary | ICD-10-CM | POA: Diagnosis not present

## 2013-06-15 DIAGNOSIS — I4891 Unspecified atrial fibrillation: Secondary | ICD-10-CM | POA: Diagnosis not present

## 2013-06-15 DIAGNOSIS — D518 Other vitamin B12 deficiency anemias: Secondary | ICD-10-CM | POA: Diagnosis not present

## 2013-06-15 DIAGNOSIS — I509 Heart failure, unspecified: Secondary | ICD-10-CM | POA: Diagnosis not present

## 2013-06-18 DIAGNOSIS — I4891 Unspecified atrial fibrillation: Secondary | ICD-10-CM | POA: Diagnosis not present

## 2013-06-18 DIAGNOSIS — M1990 Unspecified osteoarthritis, unspecified site: Secondary | ICD-10-CM | POA: Diagnosis not present

## 2013-06-18 DIAGNOSIS — M810 Age-related osteoporosis without current pathological fracture: Secondary | ICD-10-CM | POA: Diagnosis not present

## 2013-06-18 DIAGNOSIS — I10 Essential (primary) hypertension: Secondary | ICD-10-CM | POA: Diagnosis not present

## 2013-06-18 DIAGNOSIS — D510 Vitamin B12 deficiency anemia due to intrinsic factor deficiency: Secondary | ICD-10-CM | POA: Diagnosis not present

## 2013-06-18 DIAGNOSIS — I509 Heart failure, unspecified: Secondary | ICD-10-CM | POA: Diagnosis not present

## 2013-06-21 DIAGNOSIS — Z9181 History of falling: Secondary | ICD-10-CM | POA: Diagnosis not present

## 2013-06-21 DIAGNOSIS — M810 Age-related osteoporosis without current pathological fracture: Secondary | ICD-10-CM | POA: Diagnosis not present

## 2013-06-21 DIAGNOSIS — I4891 Unspecified atrial fibrillation: Secondary | ICD-10-CM | POA: Diagnosis not present

## 2013-06-21 DIAGNOSIS — Z7901 Long term (current) use of anticoagulants: Secondary | ICD-10-CM | POA: Diagnosis not present

## 2013-06-21 DIAGNOSIS — Z5181 Encounter for therapeutic drug level monitoring: Secondary | ICD-10-CM | POA: Diagnosis not present

## 2013-06-21 DIAGNOSIS — D510 Vitamin B12 deficiency anemia due to intrinsic factor deficiency: Secondary | ICD-10-CM | POA: Diagnosis not present

## 2013-06-21 DIAGNOSIS — M1990 Unspecified osteoarthritis, unspecified site: Secondary | ICD-10-CM | POA: Diagnosis not present

## 2013-06-21 DIAGNOSIS — Z96649 Presence of unspecified artificial hip joint: Secondary | ICD-10-CM | POA: Diagnosis not present

## 2013-06-21 DIAGNOSIS — I509 Heart failure, unspecified: Secondary | ICD-10-CM | POA: Diagnosis not present

## 2013-06-21 DIAGNOSIS — I10 Essential (primary) hypertension: Secondary | ICD-10-CM | POA: Diagnosis not present

## 2013-06-21 DIAGNOSIS — Z8673 Personal history of transient ischemic attack (TIA), and cerebral infarction without residual deficits: Secondary | ICD-10-CM | POA: Diagnosis not present

## 2013-07-23 DIAGNOSIS — I10 Essential (primary) hypertension: Secondary | ICD-10-CM | POA: Diagnosis not present

## 2013-07-23 DIAGNOSIS — E782 Mixed hyperlipidemia: Secondary | ICD-10-CM | POA: Diagnosis not present

## 2013-07-23 DIAGNOSIS — E039 Hypothyroidism, unspecified: Secondary | ICD-10-CM | POA: Diagnosis not present

## 2013-07-27 DIAGNOSIS — E039 Hypothyroidism, unspecified: Secondary | ICD-10-CM | POA: Diagnosis not present

## 2013-07-27 DIAGNOSIS — E782 Mixed hyperlipidemia: Secondary | ICD-10-CM | POA: Diagnosis not present

## 2013-07-27 DIAGNOSIS — I10 Essential (primary) hypertension: Secondary | ICD-10-CM | POA: Diagnosis not present

## 2013-07-27 DIAGNOSIS — IMO0002 Reserved for concepts with insufficient information to code with codable children: Secondary | ICD-10-CM | POA: Diagnosis not present

## 2013-08-20 DIAGNOSIS — Z9181 History of falling: Secondary | ICD-10-CM | POA: Diagnosis not present

## 2013-08-20 DIAGNOSIS — Z8673 Personal history of transient ischemic attack (TIA), and cerebral infarction without residual deficits: Secondary | ICD-10-CM | POA: Diagnosis not present

## 2013-08-20 DIAGNOSIS — D510 Vitamin B12 deficiency anemia due to intrinsic factor deficiency: Secondary | ICD-10-CM | POA: Diagnosis not present

## 2013-08-20 DIAGNOSIS — I509 Heart failure, unspecified: Secondary | ICD-10-CM | POA: Diagnosis not present

## 2013-08-20 DIAGNOSIS — Z96649 Presence of unspecified artificial hip joint: Secondary | ICD-10-CM | POA: Diagnosis not present

## 2013-08-20 DIAGNOSIS — Z7901 Long term (current) use of anticoagulants: Secondary | ICD-10-CM | POA: Diagnosis not present

## 2013-08-20 DIAGNOSIS — M810 Age-related osteoporosis without current pathological fracture: Secondary | ICD-10-CM | POA: Diagnosis not present

## 2013-08-20 DIAGNOSIS — Z5181 Encounter for therapeutic drug level monitoring: Secondary | ICD-10-CM | POA: Diagnosis not present

## 2013-08-20 DIAGNOSIS — M1990 Unspecified osteoarthritis, unspecified site: Secondary | ICD-10-CM | POA: Diagnosis not present

## 2013-08-20 DIAGNOSIS — I4891 Unspecified atrial fibrillation: Secondary | ICD-10-CM | POA: Diagnosis not present

## 2013-08-20 DIAGNOSIS — I10 Essential (primary) hypertension: Secondary | ICD-10-CM | POA: Diagnosis not present

## 2013-08-26 DIAGNOSIS — M810 Age-related osteoporosis without current pathological fracture: Secondary | ICD-10-CM | POA: Diagnosis not present

## 2013-08-26 DIAGNOSIS — I10 Essential (primary) hypertension: Secondary | ICD-10-CM | POA: Diagnosis not present

## 2013-08-26 DIAGNOSIS — I509 Heart failure, unspecified: Secondary | ICD-10-CM | POA: Diagnosis not present

## 2013-08-26 DIAGNOSIS — M1990 Unspecified osteoarthritis, unspecified site: Secondary | ICD-10-CM | POA: Diagnosis not present

## 2013-08-26 DIAGNOSIS — D510 Vitamin B12 deficiency anemia due to intrinsic factor deficiency: Secondary | ICD-10-CM | POA: Diagnosis not present

## 2013-08-26 DIAGNOSIS — I4891 Unspecified atrial fibrillation: Secondary | ICD-10-CM | POA: Diagnosis not present

## 2013-09-01 DIAGNOSIS — M25559 Pain in unspecified hip: Secondary | ICD-10-CM | POA: Diagnosis not present

## 2013-09-01 DIAGNOSIS — Z7901 Long term (current) use of anticoagulants: Secondary | ICD-10-CM | POA: Diagnosis not present

## 2013-09-01 DIAGNOSIS — I509 Heart failure, unspecified: Secondary | ICD-10-CM | POA: Diagnosis not present

## 2013-09-01 DIAGNOSIS — I4891 Unspecified atrial fibrillation: Secondary | ICD-10-CM | POA: Diagnosis not present

## 2013-09-01 DIAGNOSIS — Z79899 Other long term (current) drug therapy: Secondary | ICD-10-CM | POA: Diagnosis not present

## 2013-09-02 DIAGNOSIS — D510 Vitamin B12 deficiency anemia due to intrinsic factor deficiency: Secondary | ICD-10-CM | POA: Diagnosis not present

## 2013-09-02 DIAGNOSIS — I509 Heart failure, unspecified: Secondary | ICD-10-CM | POA: Diagnosis not present

## 2013-09-02 DIAGNOSIS — M1990 Unspecified osteoarthritis, unspecified site: Secondary | ICD-10-CM | POA: Diagnosis not present

## 2013-09-02 DIAGNOSIS — M810 Age-related osteoporosis without current pathological fracture: Secondary | ICD-10-CM | POA: Diagnosis not present

## 2013-09-02 DIAGNOSIS — I10 Essential (primary) hypertension: Secondary | ICD-10-CM | POA: Diagnosis not present

## 2013-09-02 DIAGNOSIS — I4891 Unspecified atrial fibrillation: Secondary | ICD-10-CM | POA: Diagnosis not present

## 2013-09-07 DIAGNOSIS — I10 Essential (primary) hypertension: Secondary | ICD-10-CM | POA: Diagnosis not present

## 2013-09-07 DIAGNOSIS — D510 Vitamin B12 deficiency anemia due to intrinsic factor deficiency: Secondary | ICD-10-CM | POA: Diagnosis not present

## 2013-09-07 DIAGNOSIS — I509 Heart failure, unspecified: Secondary | ICD-10-CM | POA: Diagnosis not present

## 2013-09-07 DIAGNOSIS — M1990 Unspecified osteoarthritis, unspecified site: Secondary | ICD-10-CM | POA: Diagnosis not present

## 2013-09-07 DIAGNOSIS — I4891 Unspecified atrial fibrillation: Secondary | ICD-10-CM | POA: Diagnosis not present

## 2013-09-07 DIAGNOSIS — M810 Age-related osteoporosis without current pathological fracture: Secondary | ICD-10-CM | POA: Diagnosis not present

## 2013-09-16 DIAGNOSIS — E039 Hypothyroidism, unspecified: Secondary | ICD-10-CM | POA: Diagnosis not present

## 2013-09-17 DIAGNOSIS — I10 Essential (primary) hypertension: Secondary | ICD-10-CM | POA: Diagnosis not present

## 2013-09-17 DIAGNOSIS — M1990 Unspecified osteoarthritis, unspecified site: Secondary | ICD-10-CM | POA: Diagnosis not present

## 2013-09-17 DIAGNOSIS — D510 Vitamin B12 deficiency anemia due to intrinsic factor deficiency: Secondary | ICD-10-CM | POA: Diagnosis not present

## 2013-09-17 DIAGNOSIS — I4891 Unspecified atrial fibrillation: Secondary | ICD-10-CM | POA: Diagnosis not present

## 2013-09-17 DIAGNOSIS — M810 Age-related osteoporosis without current pathological fracture: Secondary | ICD-10-CM | POA: Diagnosis not present

## 2013-09-17 DIAGNOSIS — I509 Heart failure, unspecified: Secondary | ICD-10-CM | POA: Diagnosis not present

## 2013-09-21 DIAGNOSIS — M810 Age-related osteoporosis without current pathological fracture: Secondary | ICD-10-CM | POA: Diagnosis not present

## 2013-09-21 DIAGNOSIS — I509 Heart failure, unspecified: Secondary | ICD-10-CM | POA: Diagnosis not present

## 2013-09-21 DIAGNOSIS — M1990 Unspecified osteoarthritis, unspecified site: Secondary | ICD-10-CM | POA: Diagnosis not present

## 2013-09-21 DIAGNOSIS — D510 Vitamin B12 deficiency anemia due to intrinsic factor deficiency: Secondary | ICD-10-CM | POA: Diagnosis not present

## 2013-09-21 DIAGNOSIS — I4891 Unspecified atrial fibrillation: Secondary | ICD-10-CM | POA: Diagnosis not present

## 2013-09-21 DIAGNOSIS — I10 Essential (primary) hypertension: Secondary | ICD-10-CM | POA: Diagnosis not present

## 2013-09-24 DIAGNOSIS — I10 Essential (primary) hypertension: Secondary | ICD-10-CM | POA: Diagnosis not present

## 2013-09-24 DIAGNOSIS — M1990 Unspecified osteoarthritis, unspecified site: Secondary | ICD-10-CM | POA: Diagnosis not present

## 2013-09-24 DIAGNOSIS — I4891 Unspecified atrial fibrillation: Secondary | ICD-10-CM | POA: Diagnosis not present

## 2013-09-24 DIAGNOSIS — M810 Age-related osteoporosis without current pathological fracture: Secondary | ICD-10-CM | POA: Diagnosis not present

## 2013-09-24 DIAGNOSIS — D510 Vitamin B12 deficiency anemia due to intrinsic factor deficiency: Secondary | ICD-10-CM | POA: Diagnosis not present

## 2013-09-24 DIAGNOSIS — I509 Heart failure, unspecified: Secondary | ICD-10-CM | POA: Diagnosis not present

## 2013-09-30 DIAGNOSIS — I509 Heart failure, unspecified: Secondary | ICD-10-CM | POA: Diagnosis not present

## 2013-09-30 DIAGNOSIS — M810 Age-related osteoporosis without current pathological fracture: Secondary | ICD-10-CM | POA: Diagnosis not present

## 2013-09-30 DIAGNOSIS — I4891 Unspecified atrial fibrillation: Secondary | ICD-10-CM | POA: Diagnosis not present

## 2013-09-30 DIAGNOSIS — D510 Vitamin B12 deficiency anemia due to intrinsic factor deficiency: Secondary | ICD-10-CM | POA: Diagnosis not present

## 2013-09-30 DIAGNOSIS — I10 Essential (primary) hypertension: Secondary | ICD-10-CM | POA: Diagnosis not present

## 2013-09-30 DIAGNOSIS — M1990 Unspecified osteoarthritis, unspecified site: Secondary | ICD-10-CM | POA: Diagnosis not present

## 2013-10-19 DIAGNOSIS — E039 Hypothyroidism, unspecified: Secondary | ICD-10-CM | POA: Diagnosis not present

## 2013-10-19 DIAGNOSIS — Z7901 Long term (current) use of anticoagulants: Secondary | ICD-10-CM | POA: Diagnosis not present

## 2013-10-19 DIAGNOSIS — D518 Other vitamin B12 deficiency anemias: Secondary | ICD-10-CM | POA: Diagnosis not present

## 2013-10-19 DIAGNOSIS — I4891 Unspecified atrial fibrillation: Secondary | ICD-10-CM | POA: Diagnosis not present

## 2013-10-26 DIAGNOSIS — Z7901 Long term (current) use of anticoagulants: Secondary | ICD-10-CM | POA: Diagnosis not present

## 2013-10-26 DIAGNOSIS — I4891 Unspecified atrial fibrillation: Secondary | ICD-10-CM | POA: Diagnosis not present

## 2013-11-24 DIAGNOSIS — E782 Mixed hyperlipidemia: Secondary | ICD-10-CM | POA: Diagnosis not present

## 2013-11-24 DIAGNOSIS — M810 Age-related osteoporosis without current pathological fracture: Secondary | ICD-10-CM | POA: Diagnosis not present

## 2013-11-24 DIAGNOSIS — Z7901 Long term (current) use of anticoagulants: Secondary | ICD-10-CM | POA: Diagnosis not present

## 2013-11-24 DIAGNOSIS — I4891 Unspecified atrial fibrillation: Secondary | ICD-10-CM | POA: Diagnosis not present

## 2013-11-24 DIAGNOSIS — I10 Essential (primary) hypertension: Secondary | ICD-10-CM | POA: Diagnosis not present

## 2013-11-24 DIAGNOSIS — D518 Other vitamin B12 deficiency anemias: Secondary | ICD-10-CM | POA: Diagnosis not present

## 2013-12-01 DIAGNOSIS — I4891 Unspecified atrial fibrillation: Secondary | ICD-10-CM | POA: Diagnosis not present

## 2013-12-01 DIAGNOSIS — Z7901 Long term (current) use of anticoagulants: Secondary | ICD-10-CM | POA: Diagnosis not present

## 2013-12-28 DIAGNOSIS — Z7901 Long term (current) use of anticoagulants: Secondary | ICD-10-CM | POA: Diagnosis not present

## 2013-12-28 DIAGNOSIS — I4891 Unspecified atrial fibrillation: Secondary | ICD-10-CM | POA: Diagnosis not present

## 2013-12-28 DIAGNOSIS — D518 Other vitamin B12 deficiency anemias: Secondary | ICD-10-CM | POA: Diagnosis not present

## 2013-12-31 DIAGNOSIS — Z7901 Long term (current) use of anticoagulants: Secondary | ICD-10-CM | POA: Diagnosis not present

## 2013-12-31 DIAGNOSIS — I4891 Unspecified atrial fibrillation: Secondary | ICD-10-CM | POA: Diagnosis not present

## 2014-02-01 DIAGNOSIS — E782 Mixed hyperlipidemia: Secondary | ICD-10-CM | POA: Diagnosis not present

## 2014-02-01 DIAGNOSIS — D518 Other vitamin B12 deficiency anemias: Secondary | ICD-10-CM | POA: Diagnosis not present

## 2014-02-01 DIAGNOSIS — I10 Essential (primary) hypertension: Secondary | ICD-10-CM | POA: Diagnosis not present

## 2014-02-01 DIAGNOSIS — I4891 Unspecified atrial fibrillation: Secondary | ICD-10-CM | POA: Diagnosis not present

## 2014-02-01 DIAGNOSIS — Z7901 Long term (current) use of anticoagulants: Secondary | ICD-10-CM | POA: Diagnosis not present

## 2014-02-08 DIAGNOSIS — K921 Melena: Secondary | ICD-10-CM | POA: Diagnosis not present

## 2014-02-08 DIAGNOSIS — IMO0002 Reserved for concepts with insufficient information to code with codable children: Secondary | ICD-10-CM | POA: Diagnosis not present

## 2014-02-08 DIAGNOSIS — Z7901 Long term (current) use of anticoagulants: Secondary | ICD-10-CM | POA: Diagnosis not present

## 2014-02-08 DIAGNOSIS — I4891 Unspecified atrial fibrillation: Secondary | ICD-10-CM | POA: Diagnosis not present

## 2014-04-16 DIAGNOSIS — K921 Melena: Secondary | ICD-10-CM | POA: Diagnosis not present

## 2014-04-26 DIAGNOSIS — I4891 Unspecified atrial fibrillation: Secondary | ICD-10-CM | POA: Diagnosis not present

## 2014-04-26 DIAGNOSIS — E039 Hypothyroidism, unspecified: Secondary | ICD-10-CM | POA: Diagnosis not present

## 2014-04-26 DIAGNOSIS — E782 Mixed hyperlipidemia: Secondary | ICD-10-CM | POA: Diagnosis not present

## 2014-04-26 DIAGNOSIS — Z23 Encounter for immunization: Secondary | ICD-10-CM | POA: Diagnosis not present

## 2014-04-26 DIAGNOSIS — I10 Essential (primary) hypertension: Secondary | ICD-10-CM | POA: Diagnosis not present

## 2014-04-26 DIAGNOSIS — Z139 Encounter for screening, unspecified: Secondary | ICD-10-CM | POA: Diagnosis not present

## 2014-04-26 DIAGNOSIS — Z0000 Encounter for general adult medical examination without abnormal findings: Secondary | ICD-10-CM | POA: Diagnosis not present

## 2014-04-26 DIAGNOSIS — Z1389 Encounter for screening for other disorder: Secondary | ICD-10-CM | POA: Diagnosis not present

## 2014-04-26 DIAGNOSIS — D519 Vitamin B12 deficiency anemia, unspecified: Secondary | ICD-10-CM | POA: Diagnosis not present

## 2014-06-25 DIAGNOSIS — D519 Vitamin B12 deficiency anemia, unspecified: Secondary | ICD-10-CM | POA: Diagnosis not present

## 2014-11-22 DIAGNOSIS — I4891 Unspecified atrial fibrillation: Secondary | ICD-10-CM | POA: Diagnosis not present

## 2014-11-22 DIAGNOSIS — M810 Age-related osteoporosis without current pathological fracture: Secondary | ICD-10-CM | POA: Diagnosis not present

## 2014-11-22 DIAGNOSIS — I10 Essential (primary) hypertension: Secondary | ICD-10-CM | POA: Diagnosis not present

## 2014-11-22 DIAGNOSIS — E039 Hypothyroidism, unspecified: Secondary | ICD-10-CM | POA: Diagnosis not present

## 2014-12-31 DIAGNOSIS — I350 Nonrheumatic aortic (valve) stenosis: Secondary | ICD-10-CM | POA: Diagnosis present

## 2014-12-31 DIAGNOSIS — Z8673 Personal history of transient ischemic attack (TIA), and cerebral infarction without residual deficits: Secondary | ICD-10-CM | POA: Diagnosis not present

## 2014-12-31 DIAGNOSIS — M6281 Muscle weakness (generalized): Secondary | ICD-10-CM | POA: Diagnosis not present

## 2014-12-31 DIAGNOSIS — M810 Age-related osteoporosis without current pathological fracture: Secondary | ICD-10-CM | POA: Diagnosis not present

## 2014-12-31 DIAGNOSIS — N179 Acute kidney failure, unspecified: Secondary | ICD-10-CM | POA: Diagnosis not present

## 2014-12-31 DIAGNOSIS — T796XXA Traumatic ischemia of muscle, initial encounter: Secondary | ICD-10-CM | POA: Diagnosis not present

## 2014-12-31 DIAGNOSIS — M6282 Rhabdomyolysis: Secondary | ICD-10-CM | POA: Diagnosis not present

## 2014-12-31 DIAGNOSIS — I5030 Unspecified diastolic (congestive) heart failure: Secondary | ICD-10-CM | POA: Diagnosis not present

## 2014-12-31 DIAGNOSIS — I5031 Acute diastolic (congestive) heart failure: Secondary | ICD-10-CM | POA: Diagnosis not present

## 2014-12-31 DIAGNOSIS — I482 Chronic atrial fibrillation: Secondary | ICD-10-CM | POA: Diagnosis not present

## 2014-12-31 DIAGNOSIS — F0390 Unspecified dementia without behavioral disturbance: Secondary | ICD-10-CM | POA: Diagnosis present

## 2014-12-31 DIAGNOSIS — I351 Nonrheumatic aortic (valve) insufficiency: Secondary | ICD-10-CM | POA: Diagnosis not present

## 2014-12-31 DIAGNOSIS — R488 Other symbolic dysfunctions: Secondary | ICD-10-CM | POA: Diagnosis not present

## 2014-12-31 DIAGNOSIS — Z79899 Other long term (current) drug therapy: Secondary | ICD-10-CM | POA: Diagnosis not present

## 2014-12-31 DIAGNOSIS — Z6821 Body mass index (BMI) 21.0-21.9, adult: Secondary | ICD-10-CM | POA: Diagnosis not present

## 2014-12-31 DIAGNOSIS — M1990 Unspecified osteoarthritis, unspecified site: Secondary | ICD-10-CM | POA: Diagnosis present

## 2014-12-31 DIAGNOSIS — R2689 Other abnormalities of gait and mobility: Secondary | ICD-10-CM | POA: Diagnosis not present

## 2014-12-31 DIAGNOSIS — R4182 Altered mental status, unspecified: Secondary | ICD-10-CM | POA: Diagnosis not present

## 2014-12-31 DIAGNOSIS — R278 Other lack of coordination: Secondary | ICD-10-CM | POA: Diagnosis not present

## 2014-12-31 DIAGNOSIS — I361 Nonrheumatic tricuspid (valve) insufficiency: Secondary | ICD-10-CM | POA: Diagnosis not present

## 2014-12-31 DIAGNOSIS — R55 Syncope and collapse: Secondary | ICD-10-CM | POA: Diagnosis present

## 2014-12-31 DIAGNOSIS — R5383 Other fatigue: Secondary | ICD-10-CM | POA: Diagnosis not present

## 2014-12-31 DIAGNOSIS — Z7982 Long term (current) use of aspirin: Secondary | ICD-10-CM | POA: Diagnosis not present

## 2014-12-31 DIAGNOSIS — I10 Essential (primary) hypertension: Secondary | ICD-10-CM | POA: Diagnosis present

## 2014-12-31 DIAGNOSIS — R296 Repeated falls: Secondary | ICD-10-CM | POA: Diagnosis not present

## 2015-01-03 DIAGNOSIS — R55 Syncope and collapse: Secondary | ICD-10-CM | POA: Diagnosis not present

## 2015-01-03 DIAGNOSIS — Z139 Encounter for screening, unspecified: Secondary | ICD-10-CM | POA: Diagnosis not present

## 2015-01-03 DIAGNOSIS — N179 Acute kidney failure, unspecified: Secondary | ICD-10-CM | POA: Diagnosis not present

## 2015-01-03 DIAGNOSIS — I350 Nonrheumatic aortic (valve) stenosis: Secondary | ICD-10-CM | POA: Diagnosis not present

## 2015-01-03 DIAGNOSIS — I5030 Unspecified diastolic (congestive) heart failure: Secondary | ICD-10-CM | POA: Diagnosis not present

## 2015-01-03 DIAGNOSIS — Z0000 Encounter for general adult medical examination without abnormal findings: Secondary | ICD-10-CM | POA: Diagnosis not present

## 2015-01-03 DIAGNOSIS — I359 Nonrheumatic aortic valve disorder, unspecified: Secondary | ICD-10-CM | POA: Diagnosis not present

## 2015-01-03 DIAGNOSIS — M6281 Muscle weakness (generalized): Secondary | ICD-10-CM | POA: Diagnosis not present

## 2015-01-03 DIAGNOSIS — R296 Repeated falls: Secondary | ICD-10-CM | POA: Diagnosis not present

## 2015-01-03 DIAGNOSIS — R2689 Other abnormalities of gait and mobility: Secondary | ICD-10-CM | POA: Diagnosis not present

## 2015-01-03 DIAGNOSIS — M6282 Rhabdomyolysis: Secondary | ICD-10-CM | POA: Diagnosis not present

## 2015-01-03 DIAGNOSIS — I4891 Unspecified atrial fibrillation: Secondary | ICD-10-CM | POA: Diagnosis not present

## 2015-01-03 DIAGNOSIS — I5031 Acute diastolic (congestive) heart failure: Secondary | ICD-10-CM | POA: Diagnosis not present

## 2015-01-03 DIAGNOSIS — Z1211 Encounter for screening for malignant neoplasm of colon: Secondary | ICD-10-CM | POA: Diagnosis not present

## 2015-01-03 DIAGNOSIS — E785 Hyperlipidemia, unspecified: Secondary | ICD-10-CM | POA: Diagnosis not present

## 2015-01-03 DIAGNOSIS — I519 Heart disease, unspecified: Secondary | ICD-10-CM | POA: Diagnosis not present

## 2015-01-03 DIAGNOSIS — R488 Other symbolic dysfunctions: Secondary | ICD-10-CM | POA: Diagnosis not present

## 2015-01-03 DIAGNOSIS — Z6822 Body mass index (BMI) 22.0-22.9, adult: Secondary | ICD-10-CM | POA: Diagnosis not present

## 2015-01-03 DIAGNOSIS — I639 Cerebral infarction, unspecified: Secondary | ICD-10-CM | POA: Diagnosis not present

## 2015-01-03 DIAGNOSIS — Z1389 Encounter for screening for other disorder: Secondary | ICD-10-CM | POA: Diagnosis not present

## 2015-01-03 DIAGNOSIS — R5383 Other fatigue: Secondary | ICD-10-CM | POA: Diagnosis not present

## 2015-01-03 DIAGNOSIS — J439 Emphysema, unspecified: Secondary | ICD-10-CM | POA: Diagnosis not present

## 2015-01-03 DIAGNOSIS — I482 Chronic atrial fibrillation: Secondary | ICD-10-CM | POA: Diagnosis not present

## 2015-01-03 DIAGNOSIS — M810 Age-related osteoporosis without current pathological fracture: Secondary | ICD-10-CM | POA: Diagnosis not present

## 2015-01-03 DIAGNOSIS — R278 Other lack of coordination: Secondary | ICD-10-CM | POA: Diagnosis not present

## 2015-01-04 DIAGNOSIS — I359 Nonrheumatic aortic valve disorder, unspecified: Secondary | ICD-10-CM | POA: Diagnosis not present

## 2015-01-04 DIAGNOSIS — R296 Repeated falls: Secondary | ICD-10-CM | POA: Diagnosis not present

## 2015-01-04 DIAGNOSIS — I4891 Unspecified atrial fibrillation: Secondary | ICD-10-CM | POA: Diagnosis not present

## 2015-01-04 DIAGNOSIS — R55 Syncope and collapse: Secondary | ICD-10-CM | POA: Diagnosis not present

## 2015-01-13 DIAGNOSIS — I639 Cerebral infarction, unspecified: Secondary | ICD-10-CM | POA: Diagnosis not present

## 2015-01-13 DIAGNOSIS — I519 Heart disease, unspecified: Secondary | ICD-10-CM | POA: Diagnosis not present

## 2015-01-13 DIAGNOSIS — Z139 Encounter for screening, unspecified: Secondary | ICD-10-CM | POA: Diagnosis not present

## 2015-01-13 DIAGNOSIS — Z1211 Encounter for screening for malignant neoplasm of colon: Secondary | ICD-10-CM | POA: Diagnosis not present

## 2015-01-13 DIAGNOSIS — Z1389 Encounter for screening for other disorder: Secondary | ICD-10-CM | POA: Diagnosis not present

## 2015-01-13 DIAGNOSIS — J439 Emphysema, unspecified: Secondary | ICD-10-CM | POA: Diagnosis not present

## 2015-01-13 DIAGNOSIS — Z6822 Body mass index (BMI) 22.0-22.9, adult: Secondary | ICD-10-CM | POA: Diagnosis not present

## 2015-01-13 DIAGNOSIS — Z0000 Encounter for general adult medical examination without abnormal findings: Secondary | ICD-10-CM | POA: Diagnosis not present

## 2015-01-13 DIAGNOSIS — E785 Hyperlipidemia, unspecified: Secondary | ICD-10-CM | POA: Diagnosis not present

## 2015-03-05 ENCOUNTER — Emergency Department (HOSPITAL_COMMUNITY): Payer: Medicare Other

## 2015-03-05 ENCOUNTER — Encounter (HOSPITAL_COMMUNITY): Payer: Self-pay | Admitting: Emergency Medicine

## 2015-03-05 ENCOUNTER — Encounter (HOSPITAL_COMMUNITY)
Admission: EM | Disposition: A | Discharge status: Skilled Nursing Facility | Payer: Self-pay | Source: Home / Self Care | Attending: Internal Medicine

## 2015-03-05 ENCOUNTER — Emergency Department (HOSPITAL_COMMUNITY): Payer: Medicare Other | Admitting: Anesthesiology

## 2015-03-05 ENCOUNTER — Inpatient Hospital Stay (HOSPITAL_COMMUNITY)
Admission: EM | Admit: 2015-03-05 | Discharge: 2015-03-09 | DRG: 253 | Disposition: A | Discharge status: Skilled Nursing Facility | Payer: Medicare Other | Attending: Internal Medicine | Admitting: Internal Medicine

## 2015-03-05 DIAGNOSIS — Z66 Do not resuscitate: Secondary | ICD-10-CM

## 2015-03-05 DIAGNOSIS — R208 Other disturbances of skin sensation: Secondary | ICD-10-CM

## 2015-03-05 DIAGNOSIS — N189 Chronic kidney disease, unspecified: Secondary | ICD-10-CM | POA: Diagnosis present

## 2015-03-05 DIAGNOSIS — I4891 Unspecified atrial fibrillation: Secondary | ICD-10-CM | POA: Diagnosis not present

## 2015-03-05 DIAGNOSIS — Z9181 History of falling: Secondary | ICD-10-CM

## 2015-03-05 DIAGNOSIS — R488 Other symbolic dysfunctions: Secondary | ICD-10-CM | POA: Diagnosis not present

## 2015-03-05 DIAGNOSIS — M810 Age-related osteoporosis without current pathological fracture: Secondary | ICD-10-CM | POA: Diagnosis not present

## 2015-03-05 DIAGNOSIS — R296 Repeated falls: Secondary | ICD-10-CM

## 2015-03-05 DIAGNOSIS — E039 Hypothyroidism, unspecified: Secondary | ICD-10-CM | POA: Diagnosis present

## 2015-03-05 DIAGNOSIS — I639 Cerebral infarction, unspecified: Secondary | ICD-10-CM | POA: Diagnosis not present

## 2015-03-05 DIAGNOSIS — E785 Hyperlipidemia, unspecified: Secondary | ICD-10-CM | POA: Diagnosis not present

## 2015-03-05 DIAGNOSIS — R2689 Other abnormalities of gait and mobility: Secondary | ICD-10-CM | POA: Diagnosis not present

## 2015-03-05 DIAGNOSIS — F0390 Unspecified dementia without behavioral disturbance: Secondary | ICD-10-CM | POA: Diagnosis present

## 2015-03-05 DIAGNOSIS — I998 Other disorder of circulatory system: Secondary | ICD-10-CM

## 2015-03-05 DIAGNOSIS — I742 Embolism and thrombosis of arteries of the upper extremities: Secondary | ICD-10-CM | POA: Diagnosis not present

## 2015-03-05 DIAGNOSIS — I482 Chronic atrial fibrillation: Secondary | ICD-10-CM | POA: Diagnosis present

## 2015-03-05 DIAGNOSIS — R278 Other lack of coordination: Secondary | ICD-10-CM | POA: Diagnosis not present

## 2015-03-05 DIAGNOSIS — R410 Disorientation, unspecified: Secondary | ICD-10-CM

## 2015-03-05 DIAGNOSIS — M79602 Pain in left arm: Secondary | ICD-10-CM | POA: Diagnosis not present

## 2015-03-05 DIAGNOSIS — F0150 Vascular dementia without behavioral disturbance: Secondary | ICD-10-CM

## 2015-03-05 DIAGNOSIS — R531 Weakness: Secondary | ICD-10-CM | POA: Diagnosis not present

## 2015-03-05 DIAGNOSIS — I638 Other cerebral infarction: Secondary | ICD-10-CM | POA: Diagnosis not present

## 2015-03-05 DIAGNOSIS — M6281 Muscle weakness (generalized): Secondary | ICD-10-CM | POA: Diagnosis not present

## 2015-03-05 DIAGNOSIS — Z515 Encounter for palliative care: Secondary | ICD-10-CM | POA: Diagnosis not present

## 2015-03-05 DIAGNOSIS — I129 Hypertensive chronic kidney disease with stage 1 through stage 4 chronic kidney disease, or unspecified chronic kidney disease: Secondary | ICD-10-CM | POA: Diagnosis present

## 2015-03-05 DIAGNOSIS — N179 Acute kidney failure, unspecified: Secondary | ICD-10-CM

## 2015-03-05 DIAGNOSIS — R200 Anesthesia of skin: Secondary | ICD-10-CM | POA: Diagnosis not present

## 2015-03-05 DIAGNOSIS — R5383 Other fatigue: Secondary | ICD-10-CM | POA: Diagnosis not present

## 2015-03-05 DIAGNOSIS — Z8673 Personal history of transient ischemic attack (TIA), and cerebral infarction without residual deficits: Secondary | ICD-10-CM | POA: Diagnosis not present

## 2015-03-05 DIAGNOSIS — I5030 Unspecified diastolic (congestive) heart failure: Secondary | ICD-10-CM | POA: Diagnosis not present

## 2015-03-05 DIAGNOSIS — Z419 Encounter for procedure for purposes other than remedying health state, unspecified: Secondary | ICD-10-CM

## 2015-03-05 DIAGNOSIS — R404 Transient alteration of awareness: Secondary | ICD-10-CM | POA: Diagnosis not present

## 2015-03-05 DIAGNOSIS — I75012 Atheroembolism of left upper extremity: Principal | ICD-10-CM | POA: Diagnosis present

## 2015-03-05 DIAGNOSIS — R51 Headache: Secondary | ICD-10-CM | POA: Diagnosis not present

## 2015-03-05 DIAGNOSIS — I519 Heart disease, unspecified: Secondary | ICD-10-CM | POA: Diagnosis not present

## 2015-03-05 DIAGNOSIS — Z7901 Long term (current) use of anticoagulants: Secondary | ICD-10-CM

## 2015-03-05 DIAGNOSIS — R2681 Unsteadiness on feet: Secondary | ICD-10-CM | POA: Diagnosis not present

## 2015-03-05 DIAGNOSIS — Z4889 Encounter for other specified surgical aftercare: Secondary | ICD-10-CM | POA: Diagnosis not present

## 2015-03-05 HISTORY — DX: Unspecified atrial fibrillation: I48.91

## 2015-03-05 HISTORY — PX: EMBOLECTOMY: SHX44

## 2015-03-05 LAB — URINE MICROSCOPIC-ADD ON

## 2015-03-05 LAB — RAPID URINE DRUG SCREEN, HOSP PERFORMED
AMPHETAMINES: NOT DETECTED
BENZODIAZEPINES: NOT DETECTED
Barbiturates: NOT DETECTED
Cocaine: NOT DETECTED
OPIATES: NOT DETECTED
Tetrahydrocannabinol: NOT DETECTED

## 2015-03-05 LAB — URINALYSIS, ROUTINE W REFLEX MICROSCOPIC
Bilirubin Urine: NEGATIVE
GLUCOSE, UA: NEGATIVE mg/dL
HGB URINE DIPSTICK: NEGATIVE
Ketones, ur: NEGATIVE mg/dL
Nitrite: NEGATIVE
PROTEIN: NEGATIVE mg/dL
SPECIFIC GRAVITY, URINE: 1.008 (ref 1.005–1.030)
UROBILINOGEN UA: 0.2 mg/dL (ref 0.0–1.0)
pH: 5 (ref 5.0–8.0)

## 2015-03-05 LAB — COMPREHENSIVE METABOLIC PANEL
ALT: 14 U/L (ref 14–54)
ALT: 14 U/L (ref 14–54)
ANION GAP: 19 — AB (ref 5–15)
AST: 28 U/L (ref 15–41)
AST: 27 U/L (ref 15–41)
Albumin: 3.9 g/dL (ref 3.5–5.0)
Albumin: 3.6 g/dL (ref 3.5–5.0)
Alkaline Phosphatase: 51 U/L (ref 38–126)
Alkaline Phosphatase: 49 U/L (ref 38–126)
Anion gap: 14 (ref 5–15)
BILIRUBIN TOTAL: 0.5 mg/dL (ref 0.3–1.2)
BUN: 37 mg/dL — ABNORMAL HIGH (ref 6–20)
BUN: 40 mg/dL — AB (ref 6–20)
CHLORIDE: 100 mmol/L — AB (ref 101–111)
CHLORIDE: 103 mmol/L (ref 101–111)
CO2: 23 mmol/L (ref 22–32)
CO2: 22 mmol/L (ref 22–32)
CREATININE: 1.53 mg/dL — AB (ref 0.44–1.00)
Calcium: 9.5 mg/dL (ref 8.9–10.3)
Calcium: 8.8 mg/dL — ABNORMAL LOW (ref 8.9–10.3)
Creatinine, Ser: 1.95 mg/dL — ABNORMAL HIGH (ref 0.44–1.00)
GFR, EST AFRICAN AMERICAN: 25 mL/min — AB (ref >60)
GFR, EST AFRICAN AMERICAN: 33 mL/min — AB (ref >60)
GFR, EST NON AFRICAN AMERICAN: 21 mL/min — AB (ref >60)
GFR, EST NON AFRICAN AMERICAN: 29 mL/min — AB (ref >60)
Glucose, Bld: 110 mg/dL — ABNORMAL HIGH (ref 65–99)
Glucose, Bld: 160 mg/dL — ABNORMAL HIGH (ref 65–99)
Potassium: 4.3 mmol/L (ref 3.5–5.1)
Potassium: 4.5 mmol/L (ref 3.5–5.1)
SODIUM: 142 mmol/L (ref 135–145)
Sodium: 139 mmol/L (ref 135–145)
TOTAL PROTEIN: 6.7 g/dL (ref 6.5–8.1)
Total Bilirubin: 0.8 mg/dL (ref 0.3–1.2)
Total Protein: 7.1 g/dL (ref 6.5–8.1)

## 2015-03-05 LAB — I-STAT CHEM 8, ED
BUN: 39 mg/dL — ABNORMAL HIGH (ref 6–20)
CALCIUM ION: 1.14 mmol/L (ref 1.13–1.30)
CHLORIDE: 100 mmol/L — AB (ref 101–111)
Creatinine, Ser: 1.7 mg/dL — ABNORMAL HIGH (ref 0.44–1.00)
Glucose, Bld: 104 mg/dL — ABNORMAL HIGH (ref 65–99)
HCT: 41 % (ref 36.0–46.0)
HEMOGLOBIN: 13.9 g/dL (ref 12.0–15.0)
Potassium: 4.2 mmol/L (ref 3.5–5.1)
SODIUM: 141 mmol/L (ref 135–145)
TCO2: 22 mmol/L (ref 0–100)

## 2015-03-05 LAB — CBC
HCT: 38.8 % (ref 36.0–46.0)
HEMATOCRIT: 35.3 % — AB (ref 36.0–46.0)
Hemoglobin: 12.1 g/dL (ref 12.0–15.0)
Hemoglobin: 11.6 g/dL — ABNORMAL LOW (ref 12.0–15.0)
MCH: 29.8 pg (ref 26.0–34.0)
MCH: 30.5 pg (ref 26.0–34.0)
MCHC: 31.2 g/dL (ref 30.0–36.0)
MCHC: 32.9 g/dL (ref 30.0–36.0)
MCV: 95.6 fL (ref 78.0–100.0)
MCV: 92.9 fL (ref 78.0–100.0)
PLATELETS: 179 10*3/uL (ref 150–400)
PLATELETS: 167 10*3/uL (ref 150–400)
RBC: 4.06 MIL/uL (ref 3.87–5.11)
RBC: 3.8 MIL/uL — AB (ref 3.87–5.11)
RDW: 14.1 % (ref 11.5–15.5)
RDW: 14.1 % (ref 11.5–15.5)
WBC: 11.9 10*3/uL — AB (ref 4.0–10.5)
WBC: 10.2 10*3/uL (ref 4.0–10.5)

## 2015-03-05 LAB — I-STAT TROPONIN, ED: TROPONIN I, POC: 0.02 ng/mL (ref 0.00–0.08)

## 2015-03-05 LAB — PROTIME-INR
INR: 1.15 (ref 0.00–1.49)
INR: 1.25 (ref 0.00–1.49)
PROTHROMBIN TIME: 14.9 s (ref 11.6–15.2)
PROTHROMBIN TIME: 15.8 s — AB (ref 11.6–15.2)

## 2015-03-05 LAB — DIFFERENTIAL
BASOS PCT: 0 % (ref 0–1)
Basophils Absolute: 0 10*3/uL (ref 0.0–0.1)
EOS ABS: 0.1 10*3/uL (ref 0.0–0.7)
EOS PCT: 1 % (ref 0–5)
Lymphocytes Relative: 24 % (ref 12–46)
Lymphs Abs: 2.9 10*3/uL (ref 0.7–4.0)
MONO ABS: 1.1 10*3/uL — AB (ref 0.1–1.0)
MONOS PCT: 9 % (ref 3–12)
Neutro Abs: 7.9 10*3/uL — ABNORMAL HIGH (ref 1.7–7.7)
Neutrophils Relative %: 66 % (ref 43–77)

## 2015-03-05 LAB — ETHANOL

## 2015-03-05 LAB — APTT: aPTT: 29 seconds (ref 24–37)

## 2015-03-05 SURGERY — EMBOLECTOMY
Anesthesia: General | Site: Arm Upper

## 2015-03-05 MED ORDER — HEPARIN SODIUM (PORCINE) 5000 UNIT/ML IJ SOLN: 5000.0000 [IU] | Freq: Three times a day (TID) | INTRAMUSCULAR | Status: DC

## 2015-03-05 MED ORDER — PHENYLEPHRINE 40 MCG/ML (10ML) SYRINGE FOR IV PUSH (FOR BLOOD PRESSURE SUPPORT)
PREFILLED_SYRINGE | INTRAVENOUS | Status: AC
  Filled 2015-03-05: qty 10

## 2015-03-05 MED ORDER — PROPOFOL 10 MG/ML IV BOLUS
INTRAVENOUS | Status: AC
  Filled 2015-03-05: qty 20

## 2015-03-05 MED ORDER — ONDANSETRON HCL 4 MG/2ML IJ SOLN
INTRAMUSCULAR | Status: AC
  Filled 2015-03-05: qty 2

## 2015-03-05 MED ORDER — ACETAMINOPHEN 325 MG RE SUPP
325.0000 mg | RECTAL | Status: DC | PRN
  Filled 2015-03-05: qty 2

## 2015-03-05 MED ORDER — LIDOCAINE-EPINEPHRINE 0.5 %-1:200000 IJ SOLN
INTRAMUSCULAR | Status: AC
  Filled 2015-03-05: qty 1

## 2015-03-05 MED ORDER — PROPOFOL 10 MG/ML IV BOLUS
INTRAVENOUS | Status: DC | PRN
  Administered 2015-03-05: 50 mg via INTRAVENOUS

## 2015-03-05 MED ORDER — FENTANYL CITRATE (PF) 100 MCG/2ML IJ SOLN: 25.0000 ug | INTRAMUSCULAR | Status: DC | PRN

## 2015-03-05 MED ORDER — ACETAMINOPHEN 325 MG PO TABS
325.0000 mg | ORAL_TABLET | ORAL | Status: DC | PRN
  Administered 2015-03-06: 650 mg via ORAL
  Filled 2015-03-05: qty 2

## 2015-03-05 MED ORDER — METOPROLOL TARTRATE 1 MG/ML IV SOLN: 2.0000 mg | INTRAVENOUS | Status: DC | PRN

## 2015-03-05 MED ORDER — PANTOPRAZOLE SODIUM 40 MG PO TBEC
40.0000 mg | DELAYED_RELEASE_TABLET | Freq: Every day | ORAL | Status: DC
  Administered 2015-03-05 – 2015-03-09 (×5): 40 mg via ORAL
  Filled 2015-03-05 (×5): qty 1

## 2015-03-05 MED ORDER — DEXTROSE 5 % IV SOLN
INTRAVENOUS | Status: AC
  Administered 2015-03-05: 1.5 g via INTRAVENOUS
  Filled 2015-03-05: qty 1.5

## 2015-03-05 MED ORDER — TRAMADOL HCL 50 MG PO TABS
50.0000 mg | ORAL_TABLET | Freq: Two times a day (BID) | ORAL | Status: DC | PRN
  Administered 2015-03-05 – 2015-03-07 (×3): 50 mg via ORAL
  Filled 2015-03-05 (×3): qty 1

## 2015-03-05 MED ORDER — ETOMIDATE 2 MG/ML IV SOLN
INTRAVENOUS | Status: AC
  Filled 2015-03-05: qty 10

## 2015-03-05 MED ORDER — POTASSIUM CHLORIDE CRYS ER 20 MEQ PO TBCR: 20.0000 meq | EXTENDED_RELEASE_TABLET | Freq: Once | ORAL | Status: DC

## 2015-03-05 MED ORDER — LISINOPRIL 20 MG PO TABS
20.0000 mg | ORAL_TABLET | Freq: Every day | ORAL | Status: DC
  Administered 2015-03-07 – 2015-03-09 (×3): 20 mg via ORAL
  Filled 2015-03-05 (×4): qty 1

## 2015-03-05 MED ORDER — FENTANYL CITRATE (PF) 250 MCG/5ML IJ SOLN
INTRAMUSCULAR | Status: AC
  Filled 2015-03-05: qty 5

## 2015-03-05 MED ORDER — HEPARIN SODIUM (PORCINE) 5000 UNIT/ML IJ SOLN
4000.0000 [IU] | Freq: Once | INTRAMUSCULAR | Status: AC
  Administered 2015-03-05: 4000 [IU] via INTRAVENOUS

## 2015-03-05 MED ORDER — LIDOCAINE HCL (CARDIAC) 20 MG/ML IV SOLN
INTRAVENOUS | Status: DC | PRN
  Administered 2015-03-05: 40 mg via INTRAVENOUS

## 2015-03-05 MED ORDER — AMLODIPINE BESYLATE 10 MG PO TABS
10.0000 mg | ORAL_TABLET | Freq: Every day | ORAL | Status: DC
  Administered 2015-03-06 – 2015-03-09 (×3): 10 mg via ORAL
  Filled 2015-03-05 (×4): qty 1

## 2015-03-05 MED ORDER — HEPARIN (PORCINE) IN NACL 100-0.45 UNIT/ML-% IJ SOLN
900.0000 [IU]/h | INTRAMUSCULAR | Status: DC
Start: 2015-03-05 — End: 2015-03-05
  Filled 2015-03-05: qty 250

## 2015-03-05 MED ORDER — 0.9 % SODIUM CHLORIDE (POUR BTL) OPTIME
TOPICAL | Status: DC | PRN
  Administered 2015-03-05: 1000 mL

## 2015-03-05 MED ORDER — NEOSTIGMINE METHYLSULFATE 10 MG/10ML IV SOLN
INTRAVENOUS | Status: AC
  Filled 2015-03-05: qty 1

## 2015-03-05 MED ORDER — LABETALOL HCL 5 MG/ML IV SOLN
10.0000 mg | INTRAVENOUS | Status: DC | PRN
  Filled 2015-03-05: qty 4

## 2015-03-05 MED ORDER — RISPERIDONE 0.5 MG PO TBDP
0.5000 mg | ORAL_TABLET | Freq: Once | ORAL | Status: AC
  Administered 2015-03-05: 0.5 mg via ORAL
  Filled 2015-03-05: qty 1

## 2015-03-05 MED ORDER — SUCCINYLCHOLINE CHLORIDE 20 MG/ML IJ SOLN
INTRAMUSCULAR | Status: AC
  Filled 2015-03-05: qty 1

## 2015-03-05 MED ORDER — METOPROLOL SUCCINATE ER 50 MG PO TB24
50.0000 mg | ORAL_TABLET | Freq: Two times a day (BID) | ORAL | Status: DC
  Administered 2015-03-06 – 2015-03-09 (×6): 50 mg via ORAL
  Filled 2015-03-05 (×9): qty 1

## 2015-03-05 MED ORDER — GLYCOPYRROLATE 0.2 MG/ML IJ SOLN
INTRAMUSCULAR | Status: AC
  Filled 2015-03-05: qty 3

## 2015-03-05 MED ORDER — GUAIFENESIN-DM 100-10 MG/5ML PO SYRP: 15.0000 mL | ORAL_SOLUTION | ORAL | Status: DC | PRN

## 2015-03-05 MED ORDER — HYDRALAZINE HCL 20 MG/ML IJ SOLN: 5.0000 mg | INTRAMUSCULAR | Status: DC | PRN

## 2015-03-05 MED ORDER — HEPARIN (PORCINE) IN NACL 100-0.45 UNIT/ML-% IJ SOLN
900.0000 [IU]/h | INTRAMUSCULAR | Status: DC
  Administered 2015-03-05: 900 [IU]/h via INTRAVENOUS
  Filled 2015-03-05: qty 250

## 2015-03-05 MED ORDER — SODIUM CHLORIDE 0.9 % IJ SOLN
INTRAMUSCULAR | Status: AC
  Filled 2015-03-05: qty 10

## 2015-03-05 MED ORDER — LEVOTHYROXINE SODIUM 25 MCG PO TABS
125.0000 ug | ORAL_TABLET | Freq: Every day | ORAL | Status: DC
  Administered 2015-03-06 – 2015-03-09 (×4): 125 ug via ORAL
  Filled 2015-03-05 (×9): qty 1

## 2015-03-05 MED ORDER — ARTIFICIAL TEARS OP OINT
TOPICAL_OINTMENT | OPHTHALMIC | Status: AC
  Filled 2015-03-05: qty 3.5

## 2015-03-05 MED ORDER — FUROSEMIDE 40 MG PO TABS
40.0000 mg | ORAL_TABLET | Freq: Every day | ORAL | Status: DC
  Administered 2015-03-06 – 2015-03-09 (×4): 40 mg via ORAL
  Filled 2015-03-05 (×5): qty 1

## 2015-03-05 MED ORDER — ONDANSETRON HCL 4 MG/2ML IJ SOLN
4.0000 mg | Freq: Four times a day (QID) | INTRAMUSCULAR | Status: DC | PRN
  Administered 2015-03-06: 4 mg via INTRAVENOUS
  Filled 2015-03-05: qty 2

## 2015-03-05 MED ORDER — ALUM & MAG HYDROXIDE-SIMETH 200-200-20 MG/5ML PO SUSP
15.0000 mL | ORAL | Status: DC | PRN
  Administered 2015-03-06: 15 mL via ORAL
  Filled 2015-03-05: qty 30

## 2015-03-05 MED ORDER — CEFAZOLIN SODIUM 1-5 GM-% IV SOLN
1.0000 g | Freq: Two times a day (BID) | INTRAVENOUS | Status: AC
  Administered 2015-03-06 (×2): 1 g via INTRAVENOUS
  Filled 2015-03-05 (×2): qty 50

## 2015-03-05 MED ORDER — PROPOFOL INFUSION 10 MG/ML OPTIME
INTRAVENOUS | Status: DC | PRN
  Administered 2015-03-05: 25 ug/kg/min via INTRAVENOUS

## 2015-03-05 MED ORDER — EPHEDRINE SULFATE 50 MG/ML IJ SOLN
INTRAMUSCULAR | Status: AC
  Filled 2015-03-05: qty 1

## 2015-03-05 MED ORDER — SODIUM CHLORIDE 0.9 % IJ SOLN: 3.0000 mL | INTRAMUSCULAR | Status: DC | PRN

## 2015-03-05 MED ORDER — HEPARIN SODIUM (PORCINE) 5000 UNIT/ML IJ SOLN
5000.0000 [IU] | Freq: Once | INTRAMUSCULAR | Status: DC
  Filled 2015-03-05: qty 1

## 2015-03-05 MED ORDER — SODIUM CHLORIDE 0.9 % IR SOLN
Status: DC | PRN
  Administered 2015-03-05: 500 mL

## 2015-03-05 MED ORDER — PHENYLEPHRINE HCL 10 MG/ML IJ SOLN
INTRAMUSCULAR | Status: AC
  Filled 2015-03-05: qty 1

## 2015-03-05 MED ORDER — PHENOL 1.4 % MT LIQD: 1.0000 | OROMUCOSAL | Status: DC | PRN

## 2015-03-05 MED ORDER — LIDOCAINE-EPINEPHRINE 0.5 %-1:200000 IJ SOLN
INTRAMUSCULAR | Status: DC | PRN
  Administered 2015-03-05: 12 mL

## 2015-03-05 MED ORDER — HEPARIN BOLUS VIA INFUSION: 5000.0000 [IU] | Freq: Once | INTRAVENOUS | Status: DC

## 2015-03-05 MED ORDER — SODIUM CHLORIDE 0.9 % IJ SOLN
3.0000 mL | Freq: Two times a day (BID) | INTRAMUSCULAR | Status: DC
  Administered 2015-03-06 – 2015-03-09 (×4): 3 mL via INTRAVENOUS

## 2015-03-05 MED ORDER — ROCURONIUM BROMIDE 50 MG/5ML IV SOLN
INTRAVENOUS | Status: AC
  Filled 2015-03-05: qty 2

## 2015-03-05 MED ORDER — LIDOCAINE HCL (PF) 1 % IJ SOLN
INTRAMUSCULAR | Status: AC
  Filled 2015-03-05: qty 30

## 2015-03-05 MED ORDER — LACTATED RINGERS IV SOLN
INTRAVENOUS | Status: DC | PRN
  Administered 2015-03-05: 16:00:00 via INTRAVENOUS

## 2015-03-05 MED ORDER — LIDOCAINE HCL (CARDIAC) 20 MG/ML IV SOLN
INTRAVENOUS | Status: AC
  Filled 2015-03-05: qty 10

## 2015-03-05 MED ORDER — DROPERIDOL 2.5 MG/ML IJ SOLN: 0.6250 mg | INTRAMUSCULAR | Status: DC | PRN

## 2015-03-05 SURGICAL SUPPLY — 52 items
BANDAGE ESMARK 6X9 LF (GAUZE/BANDAGES/DRESSINGS) IMPLANT
BENZOIN TINCTURE PRP APPL 2/3 (GAUZE/BANDAGES/DRESSINGS) ×3 IMPLANT
BNDG ESMARK 6X9 LF (GAUZE/BANDAGES/DRESSINGS)
CANISTER SUCTION 2500CC (MISCELLANEOUS) ×3 IMPLANT
CATH EMB 3FR 80CM (CATHETERS) ×3 IMPLANT
CATH EMB 4FR 80CM (CATHETERS) IMPLANT
CATH EMB 5FR 80CM (CATHETERS) IMPLANT
CLIP LIGATING EXTRA MED SLVR (CLIP) ×3 IMPLANT
CLIP LIGATING EXTRA SM BLUE (MISCELLANEOUS) ×3 IMPLANT
CLOSURE STERI-STRIP 1/2X4 (GAUZE/BANDAGES/DRESSINGS) ×1
CLOSURE WOUND 1/2 X4 (GAUZE/BANDAGES/DRESSINGS) ×1
CLSR STERI-STRIP ANTIMIC 1/2X4 (GAUZE/BANDAGES/DRESSINGS) ×2 IMPLANT
CUFF TOURNIQUET SINGLE 34IN LL (TOURNIQUET CUFF) IMPLANT
CUFF TOURNIQUET SINGLE 44IN (TOURNIQUET CUFF) IMPLANT
DRAIN SNY 10X20 3/4 PERF (WOUND CARE) IMPLANT
DRAPE X-RAY CASS 24X20 (DRAPES) IMPLANT
DRSG COVADERM 4X8 (GAUZE/BANDAGES/DRESSINGS) IMPLANT
ELECT REM PT RETURN 9FT ADLT (ELECTROSURGICAL) ×3
ELECTRODE REM PT RTRN 9FT ADLT (ELECTROSURGICAL) ×1 IMPLANT
EVACUATOR SILICONE 100CC (DRAIN) IMPLANT
GLOVE BIOGEL PI IND STRL 6.5 (GLOVE) ×1 IMPLANT
GLOVE BIOGEL PI IND STRL 7.5 (GLOVE) ×1 IMPLANT
GLOVE BIOGEL PI INDICATOR 6.5 (GLOVE) ×2
GLOVE BIOGEL PI INDICATOR 7.5 (GLOVE) ×2
GLOVE SS BIOGEL STRL SZ 7.5 (GLOVE) ×1 IMPLANT
GLOVE SUPERSENSE BIOGEL SZ 7.5 (GLOVE) ×2
GLOVE SURG SS PI 7.5 STRL IVOR (GLOVE) ×3 IMPLANT
GOWN STRL REUS W/ TWL LRG LVL3 (GOWN DISPOSABLE) ×2 IMPLANT
GOWN STRL REUS W/TWL LRG LVL3 (GOWN DISPOSABLE) ×4
KIT BASIN OR (CUSTOM PROCEDURE TRAY) ×3 IMPLANT
KIT ROOM TURNOVER OR (KITS) ×3 IMPLANT
NS IRRIG 1000ML POUR BTL (IV SOLUTION) ×6 IMPLANT
PACK PERIPHERAL VASCULAR (CUSTOM PROCEDURE TRAY) IMPLANT
PAD ARMBOARD 7.5X6 YLW CONV (MISCELLANEOUS) ×6 IMPLANT
PADDING CAST COTTON 6X4 STRL (CAST SUPPLIES) IMPLANT
SET COLLECT BLD 21X3/4 12 (NEEDLE) IMPLANT
SPONGE GAUZE 4X4 12PLY STER LF (GAUZE/BANDAGES/DRESSINGS) ×3 IMPLANT
STAPLER VISISTAT 35W (STAPLE) IMPLANT
STOPCOCK 4 WAY LG BORE MALE ST (IV SETS) IMPLANT
STRIP CLOSURE SKIN 1/2X4 (GAUZE/BANDAGES/DRESSINGS) ×2 IMPLANT
SUT ETHILON 3 0 PS 1 (SUTURE) IMPLANT
SUT PROLENE 5 0 C 1 24 (SUTURE) ×6 IMPLANT
SUT PROLENE 6 0 CC (SUTURE) ×6 IMPLANT
SUT VIC AB 2-0 CTX 36 (SUTURE) IMPLANT
SUT VIC AB 3-0 SH 27 (SUTURE) ×4
SUT VIC AB 3-0 SH 27X BRD (SUTURE) ×2 IMPLANT
SYR 3ML LL SCALE MARK (SYRINGE) ×3 IMPLANT
TAPE CLOTH SURG 4X10 WHT LF (GAUZE/BANDAGES/DRESSINGS) ×3 IMPLANT
TRAY FOLEY W/METER SILVER 16FR (SET/KITS/TRAYS/PACK) IMPLANT
TUBING EXTENTION W/L.L. (IV SETS) IMPLANT
UNDERPAD 30X30 INCONTINENT (UNDERPADS AND DIAPERS) ×3 IMPLANT
WATER STERILE IRR 1000ML POUR (IV SOLUTION) ×3 IMPLANT

## 2015-03-05 NOTE — Progress Notes (Signed)
Code stroke called at 1322, patient arrived to [facility] ED vian [facility] at 1335.  As per EMS, numbness and pain in left arm, when EMS arrived pain screaming in pain.  EMS unaware of any medical history or medications.  LSN 1200, NIHSS 3.  Left hand appears cooler, dusky/gray in color, no radial pulse palpated or dopplered.  Code stroke cancelled at 1410

## 2015-03-05 NOTE — Consult Note (Signed)
Neurology Consultation Reason for Consult: numbness Referring Physician: Code stroke(attending Fredderick Phenix, M)  CC: left hand numbness  History is obtained from:patient  HPI: Stephanie Weiss is a 81 y.o. female with an unknown PMH(other than stroke) who presents with left arm numbness. EMS sattes that when the son arrived to check on her around 12:30 pm, she was crying out in pain. Her history appears unreliable and she seems to change her story repeatedly.    LKW: unclear tpa given?: no, unclear time of onset, mild symptoms.     ROS: A 14 point ROS was performed and is negative except as noted in the HPI.   PMH: Unclear other than stroke "Ask my son"   FHx: Unable to give helpful answers   Social History:  reports that she has never smoked. She does not have any smokeless tobacco history on file. She reports that she does not drink alcohol. Her drug history is not on file.   Exam: Current vital signs: BP 116/47 mmHg  Pulse 82  Resp 22  Ht  (1.6 m)  Wt 54.999 kg (121 lb 4 oz)  BMI 21.48 kg/m2  SpO2 100% Vital signs in last 24 hours: Pulse Rate:  [82] 82 (08/27 1405) Resp:  [22] 22 (08/27 1405) BP: (116)/(47) 116/47 mmHg (08/27 1405) SpO2:  [100 %] 100 % (08/27 1405) Weight:  [54.999 kg (121 lb 4 oz)] 54.999 kg (121 lb 4 oz) (08/27 1412)   Physical Exam  Constitutional: Appears elderly Psych: Affect appropriate to situation Eyes: No scleral injection HENT: poor dentition Head: Normocephalic.  Cardiovascular: irregular heart beat Respiratory: Effort normal and breath sounds normal to anterior ascultation GI: Soft.  No distension. There is no tenderness.  Skin: WDI Ext: cold and cyanotic left hand with unpalpable pulse  Neuro: Mental Status: Patient is awake, alert, oriented to person, unable to give month, year and appears to have poor memory of recent events.  No signs of aphasia or neglect Cranial Nerves: II: Visual Fields are full. Pupils are equal,  round, and reactive to light.   III,IV, VI: EOMI without ptosis or diploplia.  V: Facial sensation is symmetric to temperature VII: Facial movement is symmetric.  VIII: hearing is intact to voice X: Uvula elevates symmetrically XI: Shoulder shrug is symmetric. XII: tongue is midline without atrophy or fasciculations.  Motor: Tone is normal. Bulk is normal. 5/5 strength was present in all four extremities.  Sensory: She has mild decreased sensation in the left hand.  Cerebellar: FNF and HKS are intact bilaterally    I have reviewed labs in epic and the results pertinent to this consultation are: Mild leukocytosis.   I have reviewed the images obtained: CT head - old strokes  Impression: 81yo F with left hand numbness. At this point, even if this did represent stroke she would not be a candidate for tPA. I suspect that this represents an ischmic limb as opposed to a neurological phenomenon. If after furether evaluation, no cause of her symptoms is identified, then a brain MRI could be obtained, but would not obtain one unless no other cause is found.   Recommendations: 1) MRI brain only if no other cause for symptoms identified. Please call if study obtained and positive for stroke.    Ritta Slot, MD [facility] [phone]  If 7pm- 7am, please page neurology on call as listed in AMION.

## 2015-03-05 NOTE — Transfer of Care (Signed)
Immediate Anesthesia Transfer of Care Note  Patient: Stephanie Weiss  Procedure(s) Performed: Procedure(s): LEFT BRACHIAL EMBOLECTOMY (N/A)  Patient Location: PACU  Anesthesia Type:MAC  Level of Consciousness: awake, alert , oriented and patient cooperative  Airway & Oxygen Therapy: Patient Spontanous Breathing and Patient connected to nasal cannula oxygen  Post-op Assessment: Report given to RN and Post -op Vital signs reviewed and stable  Post vital signs: Reviewed and stable  Last Vitals:  Filed Vitals:   03/05/15 1430  BP: 114/28  Pulse:   Temp:   Resp: 23    Complications: No apparent anesthesia complications

## 2015-03-05 NOTE — Anesthesia Preprocedure Evaluation (Addendum)
Anesthesia Evaluation  Patient identified by MRN, date of birth, ID band Patient awake and Patient confused    Reviewed: Allergy & Precautions, NPO status , Patient's Chart, lab work & pertinent test resultsPreop documentation limited or incomplete due to emergent nature of procedure.  History of Anesthesia Complications Negative for: history of anesthetic complications  Airway Mallampati: II  TM Distance: >3 FB Neck ROM: Full    Dental  (+) Edentulous Upper, Poor Dentition, Missing, Chipped, Dental Advisory Given   Pulmonary neg pulmonary ROS,  breath sounds clear to auscultation        Cardiovascular hypertension, - angina+ dysrhythmias Atrial Fibrillation Rhythm:Irregular Rate:Normal     Neuro/Psych Poor memory CVA (multiple strokes, does ambulate with assistance), Residual Symptoms    GI/Hepatic negative GI ROS, Neg liver ROS,   Endo/Other  negative endocrine ROS  Renal/GU Renal InsufficiencyRenal disease (creat 1.70)     Musculoskeletal   Abdominal   Peds  Hematology negative hematology ROS (+)   Anesthesia Other Findings   Reproductive/Obstetrics                            Anesthesia Physical Anesthesia Plan  ASA: III  Anesthesia Plan: General   Post-op Pain Management:    Induction: Intravenous  Airway Management Planned: Oral ETT  Additional Equipment:   Intra-op Plan:   Post-operative Plan: Extubation in OR  Informed Consent: I have reviewed the patients History and Physical, chart, labs and discussed the procedure including the risks, benefits and alternatives for the proposed anesthesia with the patient or authorized representative who has indicated his/her understanding and acceptance.   Dental advisory given and Only emergency history available  Plan Discussed with: CRNA and Surgeon  Anesthesia Plan Comments: (Plan routine monitors, GETA)        Anesthesia  Quick Evaluation

## 2015-03-05 NOTE — H&P (Signed)
Date: 03/05/2015               Patient Name:  Stephanie Weiss MRN: [id number]  DOB: [birth year] Age / Sex: 81 y.o., female   PCP: No primary care provider on file.         Medical Service: Internal Medicine Teaching Service         Attending Physician: Dr. Rayne Du att. providers found    First Contact: Dr. Loleta Chance Pager: [phone]  Second Contact: Dr. Duwaine Maxin Pager: [phone]       After Hours (After 5p/  First Contact Pager: [phone]  weekends / holidays): Second Contact Pager: [phone]   Chief Complaint: "My left arm feels funny."  History of Present Illness: Stephanie Weiss is a friendly lady with history of atrial fibrillation (not on anticoagulation for the last year), multiple embolic strokes, and hypertension presenting with acute onset right arm pain and weakness. She lives alone but her son checks on her four times a day. This morning, they had breakfast together and she was feeling fine. When he checked on her later in the afternoon, however, she was complaining of severe right arm pain that started out of the blue and extended down her entire arm. She felt quite weak in the affected arm. She denied any other complaints. They took her to [facility] and a code stroke was called. However, when she arrived in the emergency department at [facility], she had no right radial nor brachial pulse and her right hand appeared dusky, so Dr. Donnetta Hutching of vascular surgery was called and wheeled her up to the operating room for a brachial artery thrombectomy.  Meds: Amlodipine 45m daily Aspirin 837mLasix 4054maily Alendronate 20m53mery Saturday Metoprolol XR 50mg34mce daily Lisinopril 20mg 67my Levothyroxine 125mcg 73my B12 1000mcg i98mtion Stool softener 100mg PRN8mlergies:  NKDA  PMHx: Atrial fibrillation Hypertension Hypothyroidism  Social history: She lives alone and is neither a drinker nor a smoker.  Review of Systems  Constitutional: Negative for fever.    Cardiovascular: Negative for chest pain.  Skin: Negative for rash.  Neurological: Positive for tingling, sensory change and focal weakness. Negative for headaches.    Physical Exam: Blood pressure 114/28, pulse 82, temperature 98.5 F (36.9 C), temperature source Oral, resp. rate 23, height _0  (1.6 m), weight 54.999 kg (121 lb 4 oz), SpO2 100 %.  General: frail little lady resting in bed, with bags under her eyes, smiling and joking HEENT: no scleral icterus Cardiac: irregular rhythm, no murmurs Pulm: clear to auscultation bilaterally, breathing normally Abd: soft, nontender, BS present Ext: left radial pulse non-palpable and her hand appears cyanotic, right radial 2+, bilateral dorsalis pedis pulses 2+ Neuro: alert and oriented X3, cranial nerves II-XII grossly intact  Lab results: Basic Metabolic Panel:  Recent Labs  03/05/15 1342 03/05/15 1356  NA 142 141  K 4.3 4.2  CL 100* 100*  CO2 23  --   GLUCOSE 110* 104*  BUN 37* 39*  CREATININE 1.95* 1.70*  CALCIUM 9.5  --    Liver Function Tests:  Recent Labs  03/05/15 1342  AST 28  ALT 14  ALKPHOS 51  BILITOT 0.8  PROT 7.1  ALBUMIN 3.9   CBC:  Recent Labs  03/05/15 1342 03/05/15 1356  WBC 11.9*  --   NEUTROABS 7.9*  --   HGB 12.1 13.9  HCT 38.8 41.0  MCV 95.6  --   PLT 179  --    Coagulation:  Recent Labs  03/05/15 1342  LABPROT 14.9  INR 1.15   Imaging results:  Ct Head Wo Contrast  03/05/2015   CLINICAL DATA:  Code stroke, onset of LEFT arm numbness and pain at 1200 hours  EXAM: CT HEAD WITHOUT CONTRAST  TECHNIQUE: Contiguous axial images were obtained from the base of the skull through the vertex without intravenous contrast.  COMPARISON:  None  FINDINGS: Generalized atrophy.  Normal ventricular morphology.  No midline shift or mass effect.  Old lacunar infarcts LEFT caudate head and RIGHT basal ganglia.  Old LEFT frontal infarct extending into insula.  Small vessel chronic ischemic changes of  deep cerebral white matter.  No intracranial hemorrhage, mass lesion, evidence acute infarction.  No extra-axial fluid collections.  Partial opacification of RIGHT sphenoid sinus with osseous wall thickening suggesting chronic sinusitis.  Remaining bones and sinuses unremarkable.  Atherosclerotic calcifications of the carotid siphons.  IMPRESSION: Atrophy with small vessel chronic ischemic changes of deep cerebral white matter.  Old lacunar infarcts RIGHT basal ganglia and LEFT caudate head.  Old deep LEFT frontal infarct extending into insula.  No acute intracranial abnormalities.  Findings called to Dr. Leonel Ramsay on 03/05/2015 at 1359 hours.   Electronically Signed   By: Lavonia Dana M.D.   On: 03/05/2015 14:00    Other results: EKG: atrial fibrillation without ischemic changes  Assessment & Plan by Problem: Stephanie Weiss is a very friendly 20 year-old lady with atrial fibrillation without anti-coagulation presenting with acute-onset left sided arm pain and weakness; physical exam was notable for pulselessness, pallor, and poikilothermia in the affected arm, consistent with acute arterial embolism, most likely in the brachial artery. She was heparinized and wheeled off the there operating room for thrombectomy with Dr. Donnetta Hutching so we couldn't get a very thorough history; however a brief discussion with her son revealed she was taken off of warfarin last year because her doctor wanted to help with her medication burden. He was agreeable to re-starting a blood thinner once she finished her procedure. Her CHADS-VASc score was 8; indicating a 10% risk of stroke per year. Her EGFR when she came in was 25 so we can consider starting apixaban tomorrow. We'll also get a transthoracic echocardiogram tomorrow to look for a clot in her left atrium.  Left brachial arterial embolism in the setting of non-anticoagulated atrial fibrillation: Per above -Left brachial embolectomy with Dr. Donnetta Hutching  -Continue  heparin -Transthoracic echocardiogram tomorrow -Consider starting apixaban tomorrow  Hypertension: Pressures stable. I question her dose of metoprolol 4m XR twice daily -Continue home regiment of amlodipine 154m lisinopril 2059maily, metoprolol 26m37m twice daily  Congestive heart failure: No signs of CHF on exam -Hold furosemide in the setting of AKI  Dispo: Disposition is deferred at this time, awaiting improvement of current medical problems.  The patient does have a current PCP (No primary care provider on file.) and does [facility] after discharge.  The patient does not know have transportation limitations that hinder transportation to clinic appointments.  Signed: KyleLoleta Chance 03/05/2015, 3:49 PM

## 2015-03-05 NOTE — ED Notes (Signed)
Family, internal medicine team and Dr Early to bedside prior to pt's leaving ED.  Family updated by RN and Dr Arbie Cookey.

## 2015-03-05 NOTE — ED Provider Notes (Addendum)
CSN: [id number]     Arrival date & time 03/05/15  1351 History   First MD Initiated Contact with Patient 03/05/15 1356     Chief Complaint  Patient presents with  . Code Stroke    @EDPCLEARED @ (Consider location/radiation/quality/duration/timing/severity/associated sxs/prior Treatment) HPI Comments: Patient is brought in by [facility] as a code stroke. Per EMS, she was complaining of some pain in her left arm. Per their report, she was screaming in pain on their arrival. Currently she denies any pain in her left arm. She is reporting some numbness in the left arm. On arrival, she was evaluated by the stroke team and myself. She is not reporting any other neurologic deficits, however she does have some duskiness to her left hand and an absent radial pulse in the left arm. History is limited as the patient seems to have some underlying confusion, possibly dementia.   No past medical history on file. No past surgical history on file. No family history on file. Social History  Substance Use Topics  . Smoking status: Never Smoker   . Smokeless tobacco: Not on file  . Alcohol Use: No   OB History    No data available     Review of Systems  Unable to perform ROS: Dementia      Allergies  Review of patient's allergies indicates not on file.  Home Medications   Prior to Admission medications   Not on File   BP 116/47 mmHg  Pulse 82  Temp(Src) 98.5 F (36.9 C) (Oral)  Resp 22  Ht 5\' 3"  (1.6 m)  Wt 121 lb 4 oz (54.999 kg)  BMI 21.48 kg/m2  SpO2 100% Physical Exam  Constitutional: She is oriented to person, place, and time. She appears well-developed and well-nourished.  HENT:  Head: Normocephalic and atraumatic.  Eyes: Pupils are equal, round, and reactive to light.  Neck: Normal range of motion. Neck supple.  Cardiovascular: Normal rate, regular rhythm and normal heart sounds.   Patient has an absent left radial pulse with a dusky left hand. I am able to obtain  a brachial pulse by Doppler.  Pulmonary/Chest: Effort normal and breath sounds normal. No respiratory distress. She has no wheezes. She has no rales. She exhibits no tenderness.  Abdominal: Soft. Bowel sounds are normal. There is no tenderness. There is no rebound and no guarding.  Musculoskeletal: Normal range of motion. She exhibits no edema.  Lymphadenopathy:    She has no cervical adenopathy.  Neurological: She is alert and oriented to person, place, and time.  Patient has symmetric motor function. She has some slightly diminished sensation to light touch in the left hand as compared to the right.   Skin: Skin is warm and dry. No rash noted.  Psychiatric: She has a normal mood and affect.    ED Course  Procedures (including critical care time) Labs Review Labs Reviewed  CBC - Abnormal; Notable for the following:    WBC 11.9 (*)    All other components within normal limits  DIFFERENTIAL - Abnormal; Notable for the following:    Neutro Abs 7.9 (*)    Monocytes Absolute 1.1 (*)    All other components within normal limits  COMPREHENSIVE METABOLIC PANEL - Abnormal; Notable for the following:    Chloride 100 (*)    Glucose, Bld 110 (*)    BUN 37 (*)    Creatinine, Ser 1.95 (*)    GFR calc non Af Amer 21 (*)  GFR calc Af Amer 25 (*)    Anion gap 19 (*)    All other components within normal limits  I-STAT CHEM 8, ED - Abnormal; Notable for the following:    Chloride 100 (*)    BUN 39 (*)    Creatinine, Ser 1.70 (*)    Glucose, Bld 104 (*)    All other components within normal limits  ETHANOL  PROTIME-INR  APTT  URINE RAPID DRUG SCREEN, HOSP PERFORMED  URINALYSIS, ROUTINE W REFLEX MICROSCOPIC (NOT AT [facility])  I-STAT TROPOININ, ED    Imaging Review Ct Head Wo Contrast  03/05/2015   CLINICAL DATA:  Code stroke, onset of LEFT arm numbness and pain at 1200 hours  EXAM: CT HEAD WITHOUT CONTRAST  TECHNIQUE: Contiguous axial images were obtained from the base of the skull through  the vertex without intravenous contrast.  COMPARISON:  None  FINDINGS: Generalized atrophy.  Normal ventricular morphology.  No midline shift or mass effect.  Old lacunar infarcts LEFT caudate head and RIGHT basal ganglia.  Old LEFT frontal infarct extending into insula.  Small vessel chronic ischemic changes of deep cerebral white matter.  No intracranial hemorrhage, mass lesion, evidence acute infarction.  No extra-axial fluid collections.  Partial opacification of RIGHT sphenoid sinus with osseous wall thickening suggesting chronic sinusitis.  Remaining bones and sinuses unremarkable.  Atherosclerotic calcifications of the carotid siphons.  IMPRESSION: Atrophy with small vessel chronic ischemic changes of deep cerebral white matter.  Old lacunar infarcts RIGHT basal ganglia and LEFT caudate head.  Old deep LEFT frontal infarct extending into insula.  No acute intracranial abnormalities.  Findings called to Dr. Amada Jupiter on 03/05/2015 at 1359 hours.   Electronically Signed   By: Ulyses Southward M.D.   On: 03/05/2015 14:00   I have personally reviewed and evaluated these images and lab results as part of my medical decision-making.   EKG Interpretation   Date/Time:  Saturday March 05 2015 14:08:57 EDT Ventricular Rate:  82 PR Interval:    QRS Duration: 96 QT Interval:  407 QTC Calculation: 475 R Axis:   50 Text Interpretation:  Atrial fibrillation Probable left ventricular  hypertrophy No old tracing to compare Confirmed by Jheremy Boger  MD, Kierstan Auer  ([id number]) on 03/05/2015 3:05:01 PM      MDM   Final diagnoses:  Ischemia of upper extremity    PT with likely arterial occlusion of the left arm .The color seems to be improving a little bit in the left arm although it still dusky as compared to the right. I'm still unable to palpate a radial pulse. I consulted with Dr. Arbie Cookey, vascular surgeon on call and he is going take the patient to the OR. He is requesting hospitalist to admit the patient. In  consult with him, I did give the patient heparin bolus at 5000 units.   Of note she was assessed by Dr. Amada Jupiter with the stroke team who did not feel that her exam is consistent with an acute CVA.   I spoke with the IM teaching service who will admit the pt for unassigned  CRITICAL CARE Performed by: Melecio Cueto Total critical care time: 40 Critical care time was exclusive of separately billable procedures and treating other patients. Critical care was necessary to treat or prevent imminent or life-threatening deterioration. Critical care was time spent personally by me on the following activities: development of treatment plan with patient and/or surrogate as well as nursing, discussions with consultants, evaluation of patient's response to treatment,  examination of patient, obtaining history from patient or surrogate, ordering and performing treatments and interventions, ordering and review of laboratory studies, ordering and review of radiographic studies, pulse oximetry and re-evaluation of patient's condition.   Rolan Bucco, MD 03/05/15 1506  Rolan Bucco, MD 03/05/15 1610  Rolan Bucco, MD 03/05/15 [phone]

## 2015-03-05 NOTE — ED Notes (Signed)
Arrives from home via EMS with report of right arm pain and numbness, code stroke called in field, canceled by Dr Amada Jupiter on exam, no left radial pulse with doppler

## 2015-03-05 NOTE — Progress Notes (Signed)
Chaplain was paged to assist in locationg Pt son. Chaplain got information and was able to locate son in the hospital    03/05/15 1600  Clinical Encounter Type  Visited With Patient  Visit Type Spiritual support  Referral From Nurse  Spiritual Encounters  Spiritual Needs Emotional

## 2015-03-05 NOTE — Progress Notes (Signed)
ANTICOAGULATION CONSULT NOTE - Initial Consult  Pharmacy Consult for Heparin Indication: DVT  Allergies not on file  Patient Measurements: Height:  (160 cm) Weight: 121 lb 4 oz (54.999 kg) IBW/kg (Calculated) : 52.4 Heparin Dosing Weight:   Vital Signs: Temp: 98.5 F (36.9 C) (08/27 1414) Temp Source: Oral (08/27 1414) BP: 114/28 mmHg (08/27 1430) Pulse Rate: 82 (08/27 1405)  Labs:  Recent Labs  03/05/15 1342 03/05/15 1356  HGB 12.1 13.9  HCT 38.8 41.0  PLT 179  --   APTT 29  --   LABPROT 14.9  --   INR 1.15  --   CREATININE 1.95* 1.70*    Estimated Creatinine Clearance: 17.8 mL/min (by C-G formula based on Cr of 1.7).   Medical History: No past medical history on file.  Medications:   (Not in a hospital admission) Scheduled:  . heparin  4,000 Units Intravenous Once   Infusions:    Assessment: 81yo female presents from [facility] as code stroke with pain in L arm. Pharmacy is consulted to dose heparin for VTE treatment. Pt has absent radial pulse in L arm. CBC is wnl, sCr 1.7, trop neg x1, INR 1.15  Goal of Therapy:  Heparin level 0.3-0.7 units/ml Monitor platelets by anticoagulation protocol: Yes   Plan:  Give 4000 units bolus x 1 Start heparin infusion at 900 units/hr Check anti-Xa level in 8 hours and daily while on heparin Continue to monitor H&H and platelets  Arlean Hopping. Newman Pies, PharmD Clinical Pharmacist Pager [phone] 03/05/2015,3:11 PM

## 2015-03-05 NOTE — Anesthesia Postprocedure Evaluation (Signed)
  Anesthesia Post-op Note  Patient: Stephanie Weiss  Procedure(s) Performed: Procedure(s): LEFT BRACHIAL EMBOLECTOMY (N/A)  Patient Location: PACU  Anesthesia Type:MAC  Level of Consciousness: awake, alert , patient cooperative and responds to stimulation, remains confused (as was pre-op)  Airway and Oxygen Therapy: Patient Spontanous Breathing and Patient connected to nasal cannula oxygen  Post-op Pain: none  Post-op Assessment: Post-op Vital signs reviewed, Patient's Cardiovascular Status Stable, Respiratory Function Stable, Patent Airway, No signs of Nausea or vomiting and Pain level controlled              Post-op Vital Signs: Reviewed and stable  Last Vitals:  Filed Vitals:   03/05/15 1832  BP: 119/54  Pulse: 78  Temp: 36.7 C  Resp: 16    Complications: No apparent anesthesia complications

## 2015-03-05 NOTE — Anesthesia Procedure Notes (Signed)
Procedure Name: MAC Date/Time: 03/05/2015 4:10 PM Performed by: Demetrio Lapping Pre-anesthesia Checklist: Patient identified, Timeout performed, Emergency Drugs available, Suction available and Patient being monitored Patient Re-evaluated:Patient Re-evaluated prior to inductionOxygen Delivery Method: Nasal cannula Intubation Type: IV induction

## 2015-03-05 NOTE — Consult Note (Signed)
      Patient name: Jacobi Ryant MRN: [id number] DOB: 1921-04-11 Sex: female   Referred by: EDP  Reason for referral:  Chief Complaint  Patient presents with  . Code Stroke    HISTORY OF PRESENT ILLNESS: The patient is a 81 year old female who was transferred from [facility] initially as a code stroke. She was having difficulty moving her left arm and was having pain as well. It became apparent that she in fact had ischemia to her left arm. Does have a history of atrial fibrillation. Is a very poor historian. Her son and daughter-in-law who just arrived and they report that she does live independently  Recheck on her multiple times per day. They report that she had been on anticoagulation the past and that this and been discontinued. No documented history of complications from the coagulation. Has had strokes in the past.  No past medical history on file.  No past surgical history on file.  Social History   Social History  . Marital Status: N/A    Spouse Name: N/A  . Number of Children: N/A  . Years of Education: N/A   Occupational History  . Not on file.   Social History Main Topics  . Smoking status: Never Smoker   . Smokeless tobacco: Not on file  . Alcohol Use: No  . Drug Use: Not on file  . Sexual Activity: Not on file   Other Topics Concern  . Not on file   Social History Narrative  . No narrative on file    No family history on file.  Allergies as of 03/05/2015  . (Not on File)    No current facility-administered medications on file prior to encounter.   No current outpatient prescriptions on file prior to encounter.      PHYSICAL EXAMINATION:  General: The patient is a well-nourished female, in no acute distress. She does have some discomfort associated with her left arm Vital signs are BP 114/28 mmHg  Pulse 82  Temp(Src) 98.5 F (36.9 C) (Oral)  Resp 23  Ht  (1.6 m)  Wt 121 lb 4 oz (54.999 kg)  BMI 21.48 kg/m2  SpO2  100% Pulmonary: There is a good air exchange . Abdomen: Soft and non-tender  Musculoskeletal: There are no major deformities.   Neurologic: No focal weakness or paresthesias are detected, does have some diminished grip strength on the left. Skin: There are no ulcer or rashes noted. Psychiatric: The patient has normal affect. Cardiovascular: She has 2+ radial pulses in her right arm. 2+ femoral and 2+ dorsalis pedis pulses bilaterally. She does not have a brachial or radial or ulnar pulse in her left arm. Left arm has cyanosis and pallor complaint to her right arm   Impression and Plan:  The left arm ischemia most likely related to embolic disease from her chronic atrial fibrillation. Discussed with the patient and her family. Will take him urgently to the operating room for embolectomy.    Gretta Began [facility] Office: [phone]

## 2015-03-05 NOTE — Op Note (Signed)
    OPERATIVE REPORT  DATE OF SURGERY: 03/05/2015  PATIENT: Stephanie Weiss, 81 y.o. female MRN: [id number]  DOB: 09-05-1920  PRE-OPERATIVE DIAGNOSIS: Acute ischemia of left arm secondary to brachial embolus  POST-OPERATIVE DIAGNOSIS:  Same  PROCEDURE: Left brachial embolectomy  SURGEON:  Gretta Began, M.D.  PHYSICIAN ASSISTANT: Nurse  ANESTHESIA:  Local with sedation  EBL: Minimal ml  Total I/O In: 500 [I.V.:500] Out: -   BLOOD ADMINISTERED: None  DRAINS: None  SPECIMEN: Brachial embolus  COUNTS CORRECT:  YES  PLAN OF CARE: PACU   PATIENT DISPOSITION:  PACU - hemodynamically stable  PROCEDURE DETAILS: The patient was taken to the operative placed supine position where the area of the left axilla left arm and left hand prepped draped in usual sterile fashion. Using local anesthesia incision was made over the brachial pulse and carried down to isolate the brachial artery. The patient had a relatively small brachial artery. There was no pulse present. The patient did have a pulse high in the axilla. The patient had been given 5000 units intravenous heparin in the emergency department. The brachial artery was opened transversely with an 11 blade. There was minimal inflow or backflow. There was no clot present. A 3 Fogarty catheter was passed centrally and was removed. A great amount of chronic appearing thrombus was removed and this was sent for pathology. Excellent flow was encountered after the embolectomy. Fogarty catheter was passed again and no further embolus was removed. This was reoccluded. Next the 3 Fogarty catheter was passed towards the hand. It would not go past midforearm. Patient did have small arteries. The third catheter was removed and no clot was noted distally. The artery and the brachial artery was closed with interrupted 60 proline sutures. Clamps were removed and excellent flow was noted at the antecubital space and at the radial level at the wrist. Wounds  irrigated with saline. Hemostasis tablet cautery. Wounds were closed with 30 Vicryls in the subcutaneous and subcuticular tissue. Benzoin and Steri-Strips were applied. The patient was transferred to the recovery room in stable condition   Gretta Began, M.D. 03/05/2015 5:10 PM

## 2015-03-06 ENCOUNTER — Inpatient Hospital Stay (HOSPITAL_COMMUNITY): Payer: Medicare Other

## 2015-03-06 LAB — BASIC METABOLIC PANEL
ANION GAP: 11 (ref 5–15)
BUN: 35 mg/dL — ABNORMAL HIGH (ref 6–20)
CALCIUM: 8.9 mg/dL (ref 8.9–10.3)
CHLORIDE: 105 mmol/L (ref 101–111)
CO2: 23 mmol/L (ref 22–32)
Creatinine, Ser: 1.3 mg/dL — ABNORMAL HIGH (ref 0.44–1.00)
GFR calc Af Amer: 40 mL/min — ABNORMAL LOW (ref >60)
GFR calc non Af Amer: 35 mL/min — ABNORMAL LOW (ref >60)
GLUCOSE: 136 mg/dL — AB (ref 65–99)
Potassium: 4.3 mmol/L (ref 3.5–5.1)
Sodium: 139 mmol/L (ref 135–145)

## 2015-03-06 LAB — CBC
HEMATOCRIT: 34 % — AB (ref 36.0–46.0)
Hemoglobin: 11.2 g/dL — ABNORMAL LOW (ref 12.0–15.0)
MCH: 30.4 pg (ref 26.0–34.0)
MCHC: 32.9 g/dL (ref 30.0–36.0)
MCV: 92.1 fL (ref 78.0–100.0)
PLATELETS: 149 10*3/uL — AB (ref 150–400)
RBC: 3.69 MIL/uL — ABNORMAL LOW (ref 3.87–5.11)
RDW: 14 % (ref 11.5–15.5)
WBC: 9.5 10*3/uL (ref 4.0–10.5)

## 2015-03-06 MED ORDER — HEPARIN (PORCINE) IN NACL 100-0.45 UNIT/ML-% IJ SOLN
500.0000 [IU]/h | INTRAMUSCULAR | Status: DC
  Administered 2015-03-06: 500 [IU]/h via INTRAVENOUS
  Filled 2015-03-06 (×2): qty 250

## 2015-03-06 NOTE — Progress Notes (Signed)
Notified by RN that patient has been disoriented, agitated, and delirious overnight.  Nurse states that patient has not been redirectable, picking at her incision, and trying to get out of bed.  She had picked the bandage off of her embolectomy incision, resulting in bleeding.  She had been placed in mittens, but continued to pick at her wound and IV.  Risperidone 0.5 mg PO had no effect on agitation.  Sitter at bedside was helpful in reorienting patient, but the sitter had to leave at 3am.  Since that time, patient continues to attempt to get up and sit on side of bed.  She endorses minimal pain at site of embolectomy.  She has not slept overnight.  PE: Filed Vitals:   03/06/15 0440  BP: 114/60  Pulse: 55  Temp: 98.8 F (37.1 C)  Resp: 14  Gen: Elderly female, lying in bed, in NAD. Neuro: Not oriented to person or time. Oriented to place.  Occasionally redirectable.   A/P: Delirium. Hospital has no sitters available for patient.  - Resume mittens and wrap IV - Put 4 side rails up.  Will withhold other physical restraints at this time, though they may become necessary. - Turn off lights, turn off TV, minimize distractions.

## 2015-03-06 NOTE — Progress Notes (Signed)
Pt confused and has removed surgical dressing to left upper arm embolectomy site. Site has now saturated new dressing with blood and continues to bleed despite applying gauze to reinforce. RRT-RN called to pt's room for assistance. Also notified on call MD with teaching service. Orders received and to be implemented. Will request a Recruitment consultant. Will continue to monitor.

## 2015-03-06 NOTE — Progress Notes (Signed)
PT Cancellation Note  Patient Details Name: Stephanie Weiss MRN: [id number] DOB: 03-09-1921   Cancelled Treatment:    Reason Eval/Treat Not Completed: Patient at procedure or test/unavailable (pt in echo and unavailable)   Delorse Lek 03/06/2015, 8:12 AM Delaney Meigs, PT [phone]

## 2015-03-06 NOTE — Progress Notes (Signed)
  Echocardiogram 2D Echocardiogram has been performed.  Leta Jungling M 03/06/2015, 9:07 AM

## 2015-03-06 NOTE — Care Management Note (Signed)
Case Management Note  Patient Details  Name: Stephanie Weiss MRN: [id number] Date of Birth: May 24, 1921  Subjective/Objective:             From home alone admitted with L brachial artery embolus, s/p embolectomy.  History of dementia, embolic strokes,  and chronic afib without anticoagulation.    Action/Plan: Discharge planning  Expected Discharge Date:    unknown              Expected Discharge Plan:  Skilled Nursing Facility  In-House Referral:  Clinical Social Work  Discharge planning Services  CM Consult  Post Acute Care Choice:    Choice offered to:     DME Arranged:    DME Agency:     HH Arranged:    HH Agency:     Status of Service:  In process, will continue to follow  Medicare Important Message Given:    Date Medicare IM Given:    Medicare IM give by:    Date Additional Medicare IM Given:    Additional Medicare Important Message give by:     If discussed at Long Length of Stay Meetings, dates discussed:    Additional Comments: Charlotta Newton (Son)  [phone]  Gae Gallop [place], [place] [phone] 03/06/2015, 9:51 PM

## 2015-03-06 NOTE — Progress Notes (Signed)
ANTICOAGULATION CONSULT NOTE - Follow Up Consult  Pharmacy Consult for heparin Indication: atrial fibrillation/DVT  No Known Allergies  Patient Measurements: Height:  (160 cm) Weight: 120 lb 13 oz (54.8 kg) IBW/kg (Calculated) : 52.4 Heparin Dosing Weight:   Vital Signs: Temp: 97.9 F (36.6 C) (08/28 0827) Temp Source: Oral (08/28 0827) BP: 116/29 mmHg (08/28 1201) Pulse Rate: 77 (08/28 1201)  Labs:  Recent Labs  03/05/15 1342 03/05/15 1356 03/05/15 1948 03/06/15 0259  HGB 12.1 13.9 11.6* 11.2*  HCT 38.8 41.0 35.3* 34.0*  PLT 179  --  167 149*  APTT 29  --   --   --   LABPROT 14.9  --  15.8*  --   INR 1.15  --  1.25  --   CREATININE 1.95* 1.70* 1.53* 1.30*    Estimated Creatinine Clearance: 23.3 mL/min (by C-G formula based on Cr of 1.3).  Assessment:  81yo female presents from [facility] as code stroke with pain in L arm. Started on heparin per pharmacy for VTE. Heparin stopped by vascular surgery due to bleeding at incision site. Per MD resident, Dr. Arbie Cookey recommended heparin 500 units/hr today then start Eliquis tomorrow. Hgb 11.2, plts 149.   Goal of Therapy:  Monitor platelets by anticoagulation protocol: Yes   Plan:  Heparin 500 units/hr today per MD. Follow up starting Eliquis or other full dose anticoagulation tomorrow. Monitor CBC and S&S of bleed.   Sandi Carne, PharmD Pharmacy Resident Pager: [phone] 03/06/2015,1:01 PM

## 2015-03-06 NOTE — Progress Notes (Signed)
Subjective: Interval History: none.. . Comfortable but confused this morning. Denies pain. Reports nausea from taking multiple pills this morning. Wants her protective mittens off. Currently undergoing 2-D echocardiogram  Objective: Vital signs in last 24 hours: Temp:  [97.6 F (36.4 C)-98.8 F (37.1 C)] 98.8 F (37.1 C) (08/28 0440) Pulse Rate:  [55-82] 55 (08/28 0440) Resp:  [13-23] 14 (08/28 0440) BP: (100-120)/(28-78) 114/60 mmHg (08/28 0440) SpO2:  [96 %-100 %] 96 % (08/28 0440) Weight:  [120 lb 13 oz (54.8 kg)-121 lb 4 oz (54.999 kg)] 120 lb 13 oz (54.8 kg) (08/28 0440)  Intake/Output from previous day: 08/27 0701 - 08/28 0700 In: 819.9 [P.O.:240; I.V.:529.9; IV Piggyback:50] Out: 1250 [Urine:1250] Intake/Output this shift:    2+ left radial pulse. Some bruising but no hematoma at the incision 1 dressing was removed.  Lab Results:  Recent Labs  03/05/15 1948 03/06/15 0259  WBC 10.2 9.5  HGB 11.6* 11.2*  HCT 35.3* 34.0*  PLT 167 149*   BMET  Recent Labs  03/05/15 1948 03/06/15 0259  NA 139 139  K 4.5 4.3  CL 103 105  CO2 22 23  GLUCOSE 160* 136*  BUN 40* 35*  CREATININE 1.53* 1.30*  CALCIUM 8.8* 8.9    Studies/Results: Ct Head Wo Contrast  03/05/2015   CLINICAL DATA:  Code stroke, onset of LEFT arm numbness and pain at 1200 hours  EXAM: CT HEAD WITHOUT CONTRAST  TECHNIQUE: Contiguous axial images were obtained from the base of the skull through the vertex without intravenous contrast.  COMPARISON:  None  FINDINGS: Generalized atrophy.  Normal ventricular morphology.  No midline shift or mass effect.  Old lacunar infarcts LEFT caudate head and RIGHT basal ganglia.  Old LEFT frontal infarct extending into insula.  Small vessel chronic ischemic changes of deep cerebral white matter.  No intracranial hemorrhage, mass lesion, evidence acute infarction.  No extra-axial fluid collections.  Partial opacification of RIGHT sphenoid sinus with osseous wall thickening  suggesting chronic sinusitis.  Remaining bones and sinuses unremarkable.  Atherosclerotic calcifications of the carotid siphons.  IMPRESSION: Atrophy with small vessel chronic ischemic changes of deep cerebral white matter.  Old lacunar infarcts RIGHT basal ganglia and LEFT caudate head.  Old deep LEFT frontal infarct extending into insula.  No acute intracranial abnormalities.  Findings called to Dr. Amada Jupiter on 03/05/2015 at 1359 hours.   Electronically Signed   By: Ulyses Southward M.D.   On: 03/05/2015 14:00   Anti-infectives: Anti-infectives    Start     Dose/Rate Route Frequency Ordered Stop   03/06/15 0300  ceFAZolin (ANCEF) IVPB 1 g/50 mL premix     1 g 100 mL/hr over 30 Minutes Intravenous Every 12 hours 03/05/15 1725 03/07/15 0259   03/05/15 1552  dextrose 5 % with cefUROXime (ZINACEF) ADS Med    Comments:  Shireen Quan   : cabinet override      03/05/15 1552 03/05/15 1610      Assessment/Plan: s/p Procedure(s): LEFT BRACHIAL EMBOLECTOMY (N/A) Stable postop day 1. Currently undergoing a 2-D echo with no obvious atrial thrombus currently. Heparin on hold last night. This has completely stopped. Would feel comfortable starting with low-dose heparin today and fully converting to heparin tomorrow if no further bleeding.   LOS: 1 day   Gretta Began 03/06/2015, 8:22 AM

## 2015-03-06 NOTE — Evaluation (Signed)
Physical Therapy Evaluation Patient Details Name: Stephanie Weiss MRN: [id number] DOB: 04/16/1921 Today's Date: 03/06/2015   History of Present Illness  81 year old female with dementia, history of embolic strokes, chronic afib without anticoagulation, who presented with acute onset left arm pain and a cyanotic pulseless left arm, and was wheeled in for emergency brachial artery thrombectomy  Clinical Impression  Pt pleasant but confused disoriented to time, place, and situation. Pt lives alone with family to check on her but currently requires min assist for even basic transfers and not safe to return home. Pt was just in ST-SNF recently and was able to return home. Pt would benefit from ST-SNF again with plans for longterm ALF after rehab stay. Pt with decreased balance, strength, transfers, cognition and safety who will benefit from acute therapy to maximize mobility, function and gait to decrease fall risk and burden of care.     Follow Up Recommendations SNF;Supervision/Assistance - 24 hour (to increase mobility with ALF memory care long term)    Equipment Recommendations  None recommended by PT    Recommendations for Other Services       Precautions / Restrictions Precautions Precautions: Fall      Mobility  Bed Mobility Overal bed mobility: Needs Assistance Bed Mobility: Supine to Sit     Supine to sit: Min guard     General bed mobility comments: cues for sequence and assist of rail to complete  Transfers Overall transfer level: Needs assistance   Transfers: Sit to/from Stand Sit to Stand: Min assist         General transfer comment: assist for anterior translation, hand placement and balance from bed and chair   Ambulation/Gait Ambulation/Gait assistance: Min assist Ambulation Distance (Feet): 5 Feet Assistive device: Rolling walker (2 wheeled);1 person hand held assist     Gait velocity interpretation: Below normal speed for age/gender General Gait  Details: pt with posterior lean and without RW pt with hand held assist and reaching for environemental support and needs assist for anterior translation. With use of RW decreased physical assist but constant cues for RW use, posture and assist to direct RW. pt denied increased ambulation with both gait trials  Stairs            Wheelchair Mobility    Modified Rankin (Stroke Patients Only)       Balance Overall balance assessment: Needs assistance   Sitting balance-Leahy Scale: Fair       Standing balance-Leahy Scale: Poor                               Pertinent Vitals/Pain Pain Assessment: Faces Faces Pain Scale: Hurts a little bit Pain Location: LUE Pain Descriptors / Indicators: Sore Pain Intervention(s): Repositioned    Home Living   Living Arrangements: Alone Available Help at Discharge: Family;Available PRN/intermittently   Home Access: Stairs to enter   Entrance Stairs-Number of Steps: 3 Home Layout: One level Home Equipment: Walker - 2 wheels;Cane - single point      Prior Function Level of Independence: Needs assistance   Gait / Transfers Assistance Needed: pt furniture walks in the home  ADL's / Homemaking Assistance Needed: sponge bathes and dresses on her own with dgtr-in-lay laying out clothes every night, family does the cooking, housework and shopping        Hand Dominance        Extremity/Trunk Assessment   Upper Extremity Assessment: Generalized weakness  Lower Extremity Assessment: Generalized weakness      Cervical / Trunk Assessment: Kyphotic  Communication   Communication: HOH  Cognition Arousal/Alertness: Awake/alert Behavior During Therapy: WFL for tasks assessed/performed Overall Cognitive Status: History of cognitive impairments - at baseline       Memory: Decreased short-term memory              General Comments      Exercises        Assessment/Plan    PT Assessment  Patient needs continued PT services  PT Diagnosis Difficulty walking;Generalized weakness;Altered mental status   PT Problem List Decreased strength;Decreased cognition;Decreased activity tolerance;Decreased safety awareness;Decreased knowledge of use of DME;Decreased balance;Pain;Decreased mobility  PT Treatment Interventions Gait training;DME instruction;Functional mobility training;Stair training;Therapeutic activities;Therapeutic exercise;Balance training;Patient/family education;Cognitive remediation   PT Goals (Current goals can be found in the Care Plan section) Acute Rehab PT Goals Patient Stated Goal: agreeable to increase mobility and gait PT Goal Formulation: With patient/family Time For Goal Achievement: 03/20/15 Potential to Achieve Goals: Fair    Frequency Min 3X/week   Barriers to discharge Decreased caregiver support      Co-evaluation               End of Session   Activity Tolerance: Patient tolerated treatment well Patient left: in chair;with call bell/phone within reach;with chair alarm set;with family/visitor present Nurse Communication: Mobility status;Precautions         Time: 2956-2130 PT Time Calculation (min) (ACUTE ONLY): 20 min   Charges:   PT Evaluation $Initial PT Evaluation Tier I: 1 Procedure     PT G CodesDelorse Lek 03/06/2015, 11:37 AM Delaney Meigs, PT [phone]

## 2015-03-06 NOTE — Progress Notes (Signed)
Pt dressing continued to bleed through gauze so Dr. Arbie Cookey was notified and orders were received to change dressing and apply acewrap. Also Heparin gtt was paused and ordered to resume at 0400 on 03/06/15 by Dr. Arbie Cookey. Pharmacy notified. Pt remains confused and is agitated by interventions that are in place to maintain her safety. At this time incision site has visible bleeding, steri strips remaining in place but are saturated. Large amount of bruising noted to site however it is soft to light palpation and radial pulse remains +2. Will continue to monitor

## 2015-03-06 NOTE — Progress Notes (Signed)
Incision dressing is saturated with blood. RRT-RN called to room. Vascular Surgeon notified. At this time, incision is noted to have a level 2 hematoma present at incision site. Per vascular surgeon, dressing completely changed, and pressure dressing applied with acewrap externally. Also noted that steri strips have pulled off at this time d/t moderate bleeding. Radial pulse remains +2. Will continue to monitor

## 2015-03-06 NOTE — Progress Notes (Signed)
Patient ID: Stephanie Weiss, female   DOB: 08/03/1920, 81 y.o.   MRN: [id number]   Subjective: Stephanie Weiss was friendly and conversational today. She wasn't in any pain and was not agitated as she was last night. She said her son would be by later today.  Objective: Vital signs in last 24 hours: Filed Vitals:   03/06/15 0817 03/06/15 0827 03/06/15 1128 03/06/15 1142  BP: 122/47   81/53  Pulse: 71  75   Temp:  97.9 F (36.6 C)    TempSrc:  Oral    Resp: 14   18  Height:      Weight:      SpO2: 97%  95%    Weight change:   Intake/Output Summary (Last 24 hours) at 03/06/15 1146 Last data filed at 03/06/15 0600  Gross per 24 hour  Intake 819.85 ml  Output   1250 ml  Net -430.15 ml   General: resting in bed HEENT: no scleral icterus Cardiac: irregular rhythm, no rubs, murmurs or gallops Pulm: clear to auscultation bilaterally, moving normal volumes of air Abd: soft, nontender, nondistended, BS present Ext: left surgical site with some bruising but no apparent hematoma, good radial pulses bilaterally, moving her hands without a problem   Lab Results: Basic Metabolic Panel:  Recent Labs Lab 03/05/15 1948 03/06/15 0259  NA 139 139  K 4.5 4.3  CL 103 105  CO2 22 23  GLUCOSE 160* 136*  BUN 40* 35*  CREATININE 1.53* 1.30*  CALCIUM 8.8* 8.9   CBC:  Recent Labs Lab 03/05/15 1342  03/05/15 1948 03/06/15 0259  WBC 11.9*  --  10.2 9.5  NEUTROABS 7.9*  --   --   --   HGB 12.1  < > 11.6* 11.2*  HCT 38.8  < > 35.3* 34.0*  MCV 95.6  --  92.9 92.1  PLT 179  --  167 149*  < > = values in this interval not displayed.  Transthoracic echocardiogram 03/06/2015: Study Conclusions  - Left ventricle: The cavity size was normal. Wall thickness was increased in a pattern of moderate LVH. Systolic function was normal. The estimated ejection fraction was in the range of 55% to 60%. Wall motion was normal; there were no regional wall motion abnormalities. - Aortic  valve: Valve mobility was mildly restricted. There was mild to moderate regurgitation. - Mitral valve: Moderately calcified annulus. Moderately calcified leaflets . There was moderate regurgitation. Valve area by continuity equation (using LVOT flow): 1.98 cm^2. - Left atrium: The atrium was mildly to moderately dilated. - Right atrium: The atrium was moderately dilated. - Tricuspid valve: There was moderate regurgitation. - Pulmonary arteries: Systolic pressure was moderately increased. PA peak pressure: 55 mm Hg (S).   Medications: I have reviewed the patient's current medications. Scheduled Meds: . amLODipine  10 mg Oral Daily  .  ceFAZolin (ANCEF) IV  1 g Intravenous Q12H  . furosemide  40 mg Oral Daily  . levothyroxine  125 mcg Oral QAC breakfast  . lisinopril  20 mg Oral Daily  . metoprolol succinate  50 mg Oral BID  . pantoprazole  40 mg Oral Daily  . potassium chloride  20-40 mEq Oral Once  . sodium chloride  3 mL Intravenous Q12H   Continuous Infusions:  PRN Meds:.acetaminophen **OR** acetaminophen, alum & mag hydroxide-simeth, guaiFENesin-dextromethorphan, hydrALAZINE, labetalol, metoprolol, ondansetron, phenol, sodium chloride, traMADol   Assessment/Plan: Left brachial arterial thrombus: Likely from not being on anticoagulation for atrial fibrillation for the last year. Dr.  Early removed the thrombus yesterday; the procedure went well and she only has some slight bruising today. Per Dr. Bosie Helper recommendation, we'll anticoagulate her with heparin at 500U/hr today then we can start Eliquis tomorrow. Her son is agreeable to re-starting anticoagulation. The transthoracic echo was performed and showed an ejection fraction of 55-60% without any clot in the left atrium nor valvular abnormalities. -Heparin today at 500U/hr -Likely start Eliquis tomorrow  Hypertension: Well-controlled. -Continuing home regiment  Acute kidney injury: Her creatinine is trending down since  holding her furosemide.  Dispo: Disposition is deferred at this time, awaiting improvement of current medical problems.  The patient does not know have a current PCP (No primary care provider on file.) and does need an [facility] hospital follow-up appointment after discharge.  The patient does have transportation limitations that hinder transportation to clinic appointments.  .Services Needed at time of discharge: Y = Yes, Blank = No PT:   OT:   RN:   Equipment:   Other:     LOS: 1 day   Selina Cooley, MD 03/06/2015, 11:46 AM

## 2015-03-07 ENCOUNTER — Encounter (HOSPITAL_COMMUNITY): Payer: Self-pay | Admitting: Vascular Surgery

## 2015-03-07 DIAGNOSIS — I742 Embolism and thrombosis of arteries of the upper extremities: Secondary | ICD-10-CM

## 2015-03-07 DIAGNOSIS — Z66 Do not resuscitate: Secondary | ICD-10-CM

## 2015-03-07 DIAGNOSIS — Z9889 Other specified postprocedural states: Secondary | ICD-10-CM

## 2015-03-07 DIAGNOSIS — F0150 Vascular dementia without behavioral disturbance: Secondary | ICD-10-CM

## 2015-03-07 DIAGNOSIS — R296 Repeated falls: Secondary | ICD-10-CM

## 2015-03-07 DIAGNOSIS — Z515 Encounter for palliative care: Secondary | ICD-10-CM

## 2015-03-07 DIAGNOSIS — Z7901 Long term (current) use of anticoagulants: Secondary | ICD-10-CM

## 2015-03-07 DIAGNOSIS — R410 Disorientation, unspecified: Secondary | ICD-10-CM

## 2015-03-07 DIAGNOSIS — I4891 Unspecified atrial fibrillation: Secondary | ICD-10-CM

## 2015-03-07 LAB — CBC
HCT: 29 % — ABNORMAL LOW (ref 36.0–46.0)
Hemoglobin: 9.4 g/dL — ABNORMAL LOW (ref 12.0–15.0)
MCH: 29.8 pg (ref 26.0–34.0)
MCHC: 32.4 g/dL (ref 30.0–36.0)
MCV: 92.1 fL (ref 78.0–100.0)
PLATELETS: 135 10*3/uL — AB (ref 150–400)
RBC: 3.15 MIL/uL — AB (ref 3.87–5.11)
RDW: 14.2 % (ref 11.5–15.5)
WBC: 6.5 10*3/uL (ref 4.0–10.5)

## 2015-03-07 LAB — MRSA PCR SCREENING: MRSA by PCR: POSITIVE — AB

## 2015-03-07 LAB — HEPARIN LEVEL (UNFRACTIONATED): Heparin Unfractionated: 0.51 IU/mL (ref 0.30–0.70)

## 2015-03-07 MED ORDER — CHLORHEXIDINE GLUCONATE CLOTH 2 % EX PADS
6.0000 | MEDICATED_PAD | Freq: Every day | CUTANEOUS | Status: DC
  Administered 2015-03-08 – 2015-03-09 (×2): 6 via TOPICAL

## 2015-03-07 MED ORDER — ACETAMINOPHEN 325 MG PO TABS
650.0000 mg | ORAL_TABLET | Freq: Three times a day (TID) | ORAL | Status: DC
  Administered 2015-03-07 – 2015-03-09 (×6): 650 mg via ORAL
  Filled 2015-03-07 (×6): qty 2

## 2015-03-07 MED ORDER — RISPERIDONE 0.5 MG PO TABS
0.5000 mg | ORAL_TABLET | Freq: Two times a day (BID) | ORAL | Status: DC
  Administered 2015-03-07 – 2015-03-09 (×4): 0.5 mg via ORAL
  Filled 2015-03-07 (×7): qty 1

## 2015-03-07 MED ORDER — HEPARIN (PORCINE) IN NACL 100-0.45 UNIT/ML-% IJ SOLN
850.0000 [IU]/h | INTRAMUSCULAR | Status: DC
  Administered 2015-03-07 – 2015-03-09 (×3): 850 [IU]/h via INTRAVENOUS
  Filled 2015-03-07 (×3): qty 250

## 2015-03-07 MED ORDER — OXYCODONE HCL 5 MG PO TABS: 2.5000 mg | ORAL_TABLET | ORAL | Status: DC | PRN

## 2015-03-07 MED ORDER — MUPIROCIN 2 % EX OINT
1.0000 "application " | TOPICAL_OINTMENT | Freq: Two times a day (BID) | CUTANEOUS | Status: DC
  Administered 2015-03-07 – 2015-03-09 (×5): 1 via NASAL
  Filled 2015-03-07 (×2): qty 22

## 2015-03-07 NOTE — Consult Note (Signed)
Palliative Consult  Met with Stephanie Weiss and her family. She has one son Stephanie Weiss who along with his wife are her only care givers. They have been taking care of her for 2 years and she has had very difficult to control behaviors associated with vascular dementia. Frequent falls at home and also has been very stubborn about being in an assisted or skilled care living environment- even for her safety. They see her decline -especially with memory loss- has been dramatic over the past few months.   Current issues:  1. Delirium, acute in hospital but ongoing issues with sundowning at home. 2. Vascular Dementia 3. Acute thrombus left arm 4. Afib, chronic, stopped anticoagulation two years ago because of a major bleed.   We discussed in detail her goals of care- vascular dementia is progressive-we discussed QOL and making sure she was well cared for an safe for whatever time she has left. I discussed disease trajectory.  1. Schedule Tylenol TID for pain- oftem people with Vasc D cannot express pain but instead become agitated- oxycodone low dose for severe pain PRN - avoid tramadol- highly nauseating.  2. Delirium precautions- no need for tele, remove mitts, mitt one hand only if needed, remove IV as soon as possible, sitter if available, remove BP cuff, no foley- I stopped DM cough medicine as well.  3. Start BID Risperdal for ongoing agitation in the evenings. If severe agitation occurs I recommend Xyprexa.  4. Discussion of Code status had today- patient is DNR. Avoid re-hospitalization, comfort and QOL are main goals. She would likely qualify for hospice but her son says she is very opposed to hospice care (thinks they kill people)-son had a good experience with hospice and knows that this is not true. I recommend Palliative if she goes to SNF- please request in D/C summary that Palliative Care consult be place at [facility] for follow.  5. Anticogulation- she is very high risk for falls- high risk for  serious bleed vs no anticoag high risk for another acute event or major stroke- no good answer- she will not do well on coumadin with monitoring and in the past refused to pay for Eloquis.   Time: 12-1PM Total 60 min Greater than 50%  of this time was spent counseling and coordinating care related to the above assessment and plan.  Lane Hacker, DO Palliative Medicine

## 2015-03-07 NOTE — Progress Notes (Addendum)
  [facility] Progress Note  Subjective  - POD #2  Having some left arm incisional soreness  Objective Filed Vitals:   03/07/15 0754  BP: 114/39  Pulse:   Temp:   Resp: 16    Intake/Output Summary (Last 24 hours) at 03/07/15 1138 Last data filed at 03/07/15 1000  Gross per 24 hour  Intake    265 ml  Output    675 ml  Net   -410 ml    Left arm incision intact with surrounding ecchymosis. No hematoma. No active bleeding. 2+ left radial pulse. Sensation intact to left hand.   Assessment/Planning: 81 y.o. female is s/p: left brachial embolectomy 2 Days Post-Op   -No further bleeding.  -Ok to start Eliquis from vascular standpoint.   Raymond Gurney 03/07/2015 11:38 AM --  Laboratory CBC    Component Value Date/Time   WBC 6.5 03/07/2015 0222   HGB 9.4* 03/07/2015 0222   HCT 29.0* 03/07/2015 0222   PLT 135* 03/07/2015 0222    BMET    Component Value Date/Time   NA 139 03/06/2015 0259   K 4.3 03/06/2015 0259   CL 105 03/06/2015 0259   CO2 23 03/06/2015 0259   GLUCOSE 136* 03/06/2015 0259   BUN 35* 03/06/2015 0259   CREATININE 1.30* 03/06/2015 0259   CALCIUM 8.9 03/06/2015 0259   GFRNONAA 35* 03/06/2015 0259   GFRAA 40* 03/06/2015 0259    COAG Lab Results  Component Value Date   INR 1.25 03/05/2015   INR 1.15 03/05/2015   No results found for: PTT  Antibiotics Anti-infectives    Start     Dose/Rate Route Frequency Ordered Stop   03/06/15 0300  ceFAZolin (ANCEF) IVPB 1 g/50 mL premix     1 g 100 mL/hr over 30 Minutes Intravenous Every 12 hours 03/05/15 1725 03/06/15 1630   03/05/15 1552  dextrose 5 % with cefUROXime (ZINACEF) ADS Med    Comments:  Shireen Quan   : cabinet override      03/05/15 1552 03/05/15 1610       Maris Berger, PA-C [facility] Office: [phone] Pager: [phone] 03/07/2015 11:38 AM     I have examined the patient, reviewed and agree with above. Diffuse bruising but no  hematoma noted. 2-3+ left radial pulse. Okay to begin full anticoagulation from vascular standpoint  Gretta Began, MD 03/07/2015 3:30 PM

## 2015-03-07 NOTE — Progress Notes (Signed)
Called report to Spero Geralds, RN. Pt going to [id number].

## 2015-03-07 NOTE — Progress Notes (Addendum)
ANTICOAGULATION CONSULT NOTE - Follow Up Consult  Pharmacy Consult for heparin Indication: atrial fibrillation/DVT  No Known Allergies  Patient Measurements: Height:  (160 cm) Weight: 120 lb 13 oz (54.8 kg) IBW/kg (Calculated) : 52.4 Heparin Dosing Weight: 55kg  Vital Signs: Temp: 98.4 F (36.9 C) (08/29 1450) Temp Source: Oral (08/29 1450) BP: 114/53 mmHg (08/29 1450) Pulse Rate: 74 (08/29 1450)  Labs:  Recent Labs  03/05/15 1342 03/05/15 1356 03/05/15 1948 03/06/15 0259 03/07/15 0222 03/07/15 1858  HGB 12.1 13.9 11.6* 11.2* 9.4*  --   HCT 38.8 41.0 35.3* 34.0* 29.0*  --   PLT 179  --  167 149* 135*  --   APTT 29  --   --   --   --   --   LABPROT 14.9  --  15.8*  --   --   --   INR 1.15  --  1.25  --   --   --   HEPARINUNFRC  --   --   --   --   --  0.51  CREATININE 1.95* 1.70* 1.53* 1.30*  --   --     Estimated Creatinine Clearance: 23.3 mL/min (by C-G formula based on Cr of 1.3).  Assessment:  81yo female presents from [facility] as code stroke with pain in L arm. Started on heparin per pharmacy for VTE. Left arm ischemia s/p left brachial embolectomy POD#2. Heparin stopped by vascular surgery due to bleeding at incision site 8/28.  Dr. Arbie Cookey recommended heparin 500 units/hr on 8/28 then start full dose anticoag 8/29 - Pharmacy re-consulted to dose heparin for afib. Hg down to 9.4, plt low 135 (decreased). Likely switch to Eliquis when oral AC appropriate, per note.  Most recent HL = 0.51, which is therapeutic. No reported bleeding.  Goal of Therapy:  Monitor platelets by anticoagulation protocol: Yes  Heparin level 0.3-0.7   Plan:  - Continue heparin at 850 units/h - Daily HL/CBC -Monitor CBC trend and s/sx of bleed -F/u transition to oral AC when appropriate   Juanita Craver, PharmD, BCPS Clinical Pharmacist [phone]

## 2015-03-07 NOTE — Discharge Summary (Signed)
Name: Stephanie Weiss MRN: [id number] DOB: 10-May-1921 81 y.o. PCP: No primary care provider on file.  Date of Admission: 03/05/2015  1:51 PM Date of Discharge: 03/09/2015 Attending Physician: Aletta Edouard, MD  Discharge Diagnosis: 1. Acute brachial arterial embolism in the setting of atrial fibrillation not treated with anticoagulation, status-post embelectomy 8/27  Discharge Medications:   Medication List    TAKE these medications        acetaminophen 325 MG tablet  Commonly known as:  TYLENOL  Take 2 tablets (650 mg total) by mouth 3 (three) times daily.     alendronate 70 MG tablet  Commonly known as:  FOSAMAX  Take 70 mg by mouth once a week. Take with a full glass of water on an empty stomach.     amLODipine 10 MG tablet  Commonly known as:  NORVASC  Take 10 mg by mouth daily.     aspirin EC 81 MG tablet  Take 81 mg by mouth at bedtime.     docusate sodium 100 MG capsule  Commonly known as:  COLACE  Take 100 mg by mouth at bedtime.     enoxaparin 30 MG/0.3ML injection  Commonly known as:  LOVENOX  Inject 0.55 mLs (55 mg total) into the skin daily.  Start taking on:  03/10/2015     furosemide 40 MG tablet  Commonly known as:  LASIX  Take 40 mg by mouth daily.     levothyroxine 125 MCG tablet  Commonly known as:  SYNTHROID, LEVOTHROID  Take 125 mcg by mouth daily before breakfast.     lisinopril 20 MG tablet  Commonly known as:  PRINIVIL,ZESTRIL  Take 20 mg by mouth daily.     metoprolol succinate 50 MG 24 hr tablet  Commonly known as:  TOPROL-XL  Take 50 mg by mouth 2 (two) times daily. Take with or immediately following a meal.     potassium chloride SA 20 MEQ tablet  Commonly known as:  K-DUR,KLOR-CON  Take 20 mEq by mouth daily.     risperiDONE 0.5 MG tablet  Commonly known as:  RISPERDAL  Take 1 tablet (0.5 mg total) by mouth 2 (two) times daily.     warfarin 4 MG tablet  Commonly known as:  COUMADIN  Take 1 tablet (4 mg total) by mouth  daily at 6 PM. For goal INR 2.0-3.0        Disposition and follow-up:   StephaniePassion Weiss was discharged from [facility] in Good condition.  At the hospital follow up visit please address:  Post-op healing after arterial thrombectomy Fall risk INR on warfarin  Labs / imaging needed at time of follow-up: INRs  Pending labs/ test needing follow-up: None  Consultations: Vascular surgery  Procedures Performed:  Ct Head Wo Contrast  03/05/2015   CLINICAL DATA:  Code stroke, onset of LEFT arm numbness and pain at 1200 hours  EXAM: CT HEAD WITHOUT CONTRAST  TECHNIQUE: Contiguous axial images were obtained from the base of the skull through the vertex without intravenous contrast.  COMPARISON:  None  FINDINGS: Generalized atrophy.  Normal ventricular morphology.  No midline shift or mass effect.  Old lacunar infarcts LEFT caudate head and RIGHT basal ganglia.  Old LEFT frontal infarct extending into insula.  Small vessel chronic ischemic changes of deep cerebral white matter.  No intracranial hemorrhage, mass lesion, evidence acute infarction.  No extra-axial fluid collections.  Partial opacification of RIGHT sphenoid sinus with osseous wall thickening suggesting chronic sinusitis.  Remaining bones and sinuses unremarkable.  Atherosclerotic calcifications of the carotid siphons.  IMPRESSION: Atrophy with small vessel chronic ischemic changes of deep cerebral white matter.  Old lacunar infarcts RIGHT basal ganglia and LEFT caudate head.  Old deep LEFT frontal infarct extending into insula.  No acute intracranial abnormalities.  Findings called to Dr. Amada Jupiter on 03/05/2015 at 1359 hours.   Electronically Signed   By: Ulyses Southward M.D.   On: 03/05/2015 14:00    2D Echo 02/2015:  Study Conclusions  - Left ventricle: The cavity size was normal. Wall thickness was increased in a pattern of moderate LVH. Systolic function was normal. The estimated ejection fraction was in the  range of 55% to 60%. Wall motion was normal; there were no regional wall motion abnormalities. - Aortic valve: Valve mobility was mildly restricted. There was mild to moderate regurgitation. - Mitral valve: Moderately calcified annulus. Moderately calcified leaflets . There was moderate regurgitation. Valve area by continuity equation (using LVOT flow): 1.98 cm^2. - Left atrium: The atrium was mildly to moderately dilated. - Right atrium: The atrium was moderately dilated. - Tricuspid valve: There was moderate regurgitation. - Pulmonary arteries: Systolic pressure was moderately increased. PA peak pressure: 55 mm Hg (S).  Admission HPI:  [name] is a friendly 19 year old lady with history of atrial fibrillation (not on anticoagulation for the last year with rationale for decreasing medicine burden), multiple embolic strokes, and hypertension presenting with acute onset right arm pain and weakness. She lives alone but her son checks on her four times a day. This morning, they had breakfast together and she was feeling fine. When he checked on her later in the afternoon, however, she was complaining of severe right arm pain that started out of the blue and extended down her entire arm. She felt quite weak in the affected arm. She denied any other complaints. They took her to [facility] and a code stroke was called. However, when she arrived in the emergency department at [facility], she had no right radial nor brachial pulse and her right hand appeared dusky, so Dr. Arbie Cookey of vascular surgery was called and wheeled her up to the operating room for a brachial artery thrombectomy.  Hospital Course by problem list:  1. Acute brachial arterial embolism: Stephanie Weiss presented with pulseless left arm, found to have an acute brachial artery embolus. She was given a heparin bolus and sent to the operating room for an emergent thrombectomy on 8/27. The procedure went well, without  complication. The following day, she was infused with a slow rate of heparin at 500U/hr. On 8/29, she was fully heparinized for her atrial fibrillation and experienced no problems with bleeding. We had a lengthy discussion with Mrs. Giammona, her son, Hessie Diener, and her daughter-in-law, Karie Soda, regarding anticoagulation going forward. They noticed her vascular dementia had progressed since being off of anticoagulation for the last year, however they were also nervous about her fall risk and the potential for a deadly bleed while anticoagulated. They ultimately decided that the benefit of starting anticoagulation outweighed the risk. They considered Eliquis but said this was too expensive, given it was $200 per month which our pharmacists verified. They suspected it was so costly because she was in the donut hole this time of year. We started warfarin on 8/30 and discharged her with a 30 day supply of 4mg  warfarin that should be titrated with a goal INR of 2.0-3.0; this should be monitored at her SNF. She was  also discharged home with a one week supply of enoxaparin  daily, given her creatinine clearance was less than 30, to bridge her until her INR becomes therapeutic. Lastly, palliative recommended discharging her with risperidone 0.5mg  twice daily and acetaminophen  TID for her pain during the rehabilitation phase to help alleviate her delirium. IF THERE IS AN ACUTE DECLINE OR ADDITIONAL NEEDS, please refer this patient to Palliative Services, per the family's request.  Discharge Vitals:   BP 114/58 mmHg  Pulse 60  Temp(Src) 97.9 F (36.6 C) (Oral)  Resp 18  Ht  (1.6 m)  Wt 54.8 kg (120 lb 13 oz)  BMI 21.41 kg/m2  SpO2 95%  Discharge Labs:  Results for orders placed or performed during the hospital encounter of 03/05/15 (from the past 24 hour(s))  Heparin level (unfractionated)     Status: None   Collection Time: 03/09/15  3:34 AM  Result Value Ref Range   Heparin Unfractionated 0.70 0.30  - 0.70 IU/mL  Protime-INR     Status: Abnormal   Collection Time: 03/09/15  3:34 AM  Result Value Ref Range   Prothrombin Time 15.6 (H) 11.6 - 15.2 seconds   INR 1.22 0.00 - 1.49    Signed: Selina Cooley, MD 03/09/2015, 10:36 AM

## 2015-03-07 NOTE — Progress Notes (Signed)
ANTICOAGULATION CONSULT NOTE - Follow Up Consult  Pharmacy Consult for heparin Indication: atrial fibrillation/DVT  No Known Allergies  Patient Measurements: Height:  (160 cm) Weight: 120 lb 13 oz (54.8 kg) IBW/kg (Calculated) : 52.4 Heparin Dosing Weight: 55kg  Vital Signs: Temp: 98.4 F (36.9 C) (08/29 0747) Temp Source: Oral (08/29 0747) BP: 121/36 mmHg (08/29 0315)  Labs:  Recent Labs  03/05/15 1342 03/05/15 1356 03/05/15 1948 03/06/15 0259 03/07/15 0222  HGB 12.1 13.9 11.6* 11.2* 9.4*  HCT 38.8 41.0 35.3* 34.0* 29.0*  PLT 179  --  167 149* 135*  APTT 29  --   --   --   --   LABPROT 14.9  --  15.8*  --   --   INR 1.15  --  1.25  --   --   CREATININE 1.95* 1.70* 1.53* 1.30*  --     Estimated Creatinine Clearance: 23.3 mL/min (by C-G formula based on Cr of 1.3).  Assessment:  81yo female presents from [facility] as code stroke with pain in L arm. Started on heparin per pharmacy for VTE. Left arm ischemia s/p left brachial embolectomy POD#2. Heparin stopped by vascular surgery due to bleeding at incision site 8/28.  Dr. Arbie Cookey recommended heparin 500 units/hr on 8/28 then start full dose anticoag 8/29 - Pharmacy re-consulted to dose heparin for afib. Hg down to 9.4, plt low 135 (decreased). No bleed/IV line issues per RN. Monitor CBC trend. Likely to eliquis when oral AC appropriate, per note.  Goal of Therapy:  Monitor platelets by anticoagulation protocol: Yes   Plan:  -Increase heparin to 850 units/h -8h HL, daily HL/CBC -Monitor CBC trend and s/sx of bleed -F/u transition to oral AC when appropriate   Babs Bertin, PharmD Clinical Pharmacist Pager [phone] 03/07/2015 10:49 AM

## 2015-03-07 NOTE — Progress Notes (Signed)
Patient ID: Stephanie Weiss, female   DOB: [birth year], 81 y.o.   MRN: [id number]   Subjective: Stephanie Weiss said she had some pain in her arm last night but she's feeling much better after getting some tramadol. We spoke with her son and daughter-in-law who said they're looking for a rehab facility and would like to talk to the social worker today. I told them I'd touch base to let them know. Otherwise, she has no complaints. No bleeding overnight and she was less delirious once her pain was better controlled with tramadol.  Objective: Vital signs in last 24 hours: Filed Vitals:   03/06/15 2330 03/07/15 0315 03/07/15 0747 03/07/15 0754  BP: 127/93 121/36  114/39  Pulse:      Temp:  98.4 F (36.9 C) 98.4 F (36.9 C)   TempSrc:  Oral Oral   Resp: Height:      Weight:      SpO2:  96%     General: resting in bed HEENT: waxy-appearing bags under her eyes, possibly amyloidosis Cardiac: irregular, no murmurs Pulm: clear anteriorly, breathing well Abd: soft, nontender, nondistended, BS present Ext: left arm bruising a little worse than yesterday but no enormous hematoma. Radial pulses 2+ bilaterally, moving both hands well.  Lab Results:  CBC:  Recent Labs Lab 03/05/15 1342  03/06/15 0259 03/07/15 0222  WBC 11.9*  < > 9.5 6.5  NEUTROABS 7.9*  --   --   --   HGB 12.1  < > 11.2* 9.4*  HCT 38.8  < > 34.0* 29.0*  MCV 95.6  < > 92.1 92.1  PLT 179  < > 149* 135*  < > = values in this interval not displayed.   Medications: I have reviewed the patient's current medications. Scheduled Meds: . amLODipine  10 mg Oral Daily  . furosemide  40 mg Oral Daily  . levothyroxine  125 mcg Oral QAC breakfast  . lisinopril  20 mg Oral Daily  . metoprolol succinate  50 mg Oral BID  . pantoprazole  40 mg Oral Daily  . potassium chloride  20-40 mEq Oral Once  . sodium chloride  3 mL Intravenous Q12H   Continuous Infusions: . heparin     PRN Meds:.acetaminophen **OR** acetaminophen,  alum & mag hydroxide-simeth, guaiFENesin-dextromethorphan, hydrALAZINE, labetalol, metoprolol, ondansetron, phenol, sodium chloride, traMADol   Assessment/Plan:  Left brachial arterial thrombus status-post emebolectomy 8/27: Her pain is well-controlled and she has some normal post-op ecchymoses. I'm a little concerned her hemoglobin dropped 2 points overnight but it doesn't appear to be from her surgical site. We'll start heparin drip for her atrial fibrillation and if she starts bleeding we can stop it quickly. Hopefully we can start Eliquis tomorrow if all goes well today but I'm nervous doing so because we cannot easily reverse it. -Heparin today per pharmacy -Likely start Eliquis tomorrow  Dispo: Disposition is deferred at this time, awaiting improvement of current medical problems. -I called social work so they can talk to the family today about SNF and rehabilitation options. They're looking at two places in particular that are close to their home in [place]. Thanks for your help  The patient does not know have a current PCP (No primary care provider on file.) and does need an [facility] hospital follow-up appointment after discharge.  The patient does have transportation limitations that hinder transportation to clinic appointments.  .Services Needed at time of discharge: Y = Yes, Blank = No PT:   OT:  RN:   Equipment:   Other:     LOS: 2 days   Selina Cooley, MD 03/07/2015, 10:57 AM

## 2015-03-07 NOTE — Progress Notes (Signed)
OT Cancellation Note  Patient Details Name: Stephanie Weiss MRN: [id number] DOB: April 29, 1921   Cancelled Treatment:    Reason Eval/Treat Not Completed: Other (comment) Pt in process of being transferred to  Another floor. Will return this pm if able.  [facility] Nacole Fluhr, OTR/L  [phone] 03/07/2015 03/07/2015, 2:27 PM

## 2015-03-08 DIAGNOSIS — I998 Other disorder of circulatory system: Secondary | ICD-10-CM

## 2015-03-08 LAB — CBC
HCT: 30.4 % — ABNORMAL LOW (ref 36.0–46.0)
HEMOGLOBIN: 9.6 g/dL — AB (ref 12.0–15.0)
MCH: 29.5 pg (ref 26.0–34.0)
MCHC: 31.6 g/dL (ref 30.0–36.0)
MCV: 93.5 fL (ref 78.0–100.0)
PLATELETS: 147 10*3/uL — AB (ref 150–400)
RBC: 3.25 MIL/uL — AB (ref 3.87–5.11)
RDW: 14.2 % (ref 11.5–15.5)
WBC: 6.1 10*3/uL (ref 4.0–10.5)

## 2015-03-08 LAB — HEPARIN LEVEL (UNFRACTIONATED): HEPARIN UNFRACTIONATED: 0.61 [IU]/mL (ref 0.30–0.70)

## 2015-03-08 MED ORDER — WARFARIN - PHARMACIST DOSING INPATIENT
Freq: Every day | Status: DC
  Administered 2015-03-08: 18:00:00

## 2015-03-08 MED ORDER — WARFARIN VIDEO
Freq: Once | Status: AC
  Administered 2015-03-08: 18:00:00

## 2015-03-08 MED ORDER — COUMADIN BOOK
Freq: Once | Status: AC
  Administered 2015-03-08: 1
  Filled 2015-03-08: qty 1

## 2015-03-08 MED ORDER — ACETAMINOPHEN 325 MG PO TABS: 650.0000 mg | ORAL_TABLET | Freq: Three times a day (TID) | ORAL | Status: AC

## 2015-03-08 MED ORDER — WARFARIN SODIUM 4 MG PO TABS
4.0000 mg | ORAL_TABLET | Freq: Once | ORAL | Status: AC
  Administered 2015-03-08: 4 mg via ORAL
  Filled 2015-03-08 (×2): qty 1

## 2015-03-08 NOTE — Progress Notes (Signed)
Patient ID: Stephanie Weiss, female   DOB: 29-Jun-1921, 81 y.o.   MRN: [id number] Comfortable with mild confusion this morning Left brachial incision with mild swelling and bruising present. 2+ left radial pulse. Stable from surgical standpoint.

## 2015-03-08 NOTE — Discharge Instructions (Addendum)
Stephanie Weiss,  It was a pleasure taking care of you here at [facility]. I'm sorry I had to meet you under such circumstances.  You came in right severe left arm pain and we found a blood clot in your artery. This was probably caused by your atrial fibrillation. Essentially, a part of your hear flutters and blood can't flow through, so it hardens into a clot and then can become dislodged and go to your arm. Dr. Arbie Cookey cut out the clot and got the blood flow back to your arm. After a long discussion with you, your son, and your daughter-in-law, we decided to start warfarin to prevent future strokes and problems such as this. You'll need to get frequent INR checks to make sure your blood is neither too thin nor too thick.  Again, it was a pleasure getting to know you and your family, Dr. Earnest Conroy  wWarfarin: What You Need to Know Warfarin is an anticoagulant. Anticoagulants help prevent the formation of blood clots. They also help stop the growth of blood clots. Warfarin is sometimes referred to as a "blood thinner."  Normally, when body tissues are cut or damaged, the blood clots in order to prevent blood loss. Sometimes clots form inside your blood vessels and obstruct the flow of blood through your circulatory system (thrombosis). These clots may travel through your bloodstream and become lodged in smaller blood vessels in your brain, which can cause a stroke, or in your lungs (pulmonary embolism). WHO SHOULD USE WARFARIN? Warfarin is prescribed for people at risk of developing harmful blood clots:  People with surgically implanted mechanical heart valves, irregular heart rhythms called atrial fibrillation, and certain clotting disorders.  People who have developed harmful blood clotting in the past, including those who have had a stroke or a pulmonary embolism, or thrombosis in their legs (deep vein thrombosis [DVT]).  People with an existing blood clot, such as a pulmonary embolism. WARFARIN  DOSING Warfarin tablets come in different strengths. Each tablet strength is a different color, with the amount of warfarin (in milligrams) clearly printed on the tablet. If the color of your tablet is different than usual when you receive a new prescription, report it immediately to your pharmacist or health care provider. WARFARIN MONITORING The goal of warfarin therapy is to lessen the clotting tendency of blood but not prevent clotting completely. Your health care provider will monitor the anticoagulation effect of warfarin closely and adjust your dose as needed. For your safety, blood tests called prothrombin time (PT) or international normalized ratio (INR) are used to measure the effects of warfarin. Both of these tests can be done with a finger stick or a blood draw. The longer it takes the blood to clot, the higher the PT or INR. Your health care provider will inform you of your "target" PT or INR range. If, at any time, your PT or INR is above the target range, there is a risk of bleeding. If your PT or INR is below the target range, there is a risk of clotting. Whether you are started on warfarin while you are in the hospital or in your health care provider's office, you will need to have your PT or INR checked within one week of starting the medicine. Initially, some people are asked to have their PT or INR checked as much as twice a week. Once you are on a stable maintenance dose, the PT or INR is checked less often, usually once every 2 to 4 weeks.  The warfarin dose may be adjusted if the PT or INR is not within the target range. It is important to keep all laboratory and health care provider follow-up appointments. Not keeping appointments could result in a chronic or permanent injury, pain, or disability because warfarin is a medicine that requires close monitoring. WHAT ARE THE SIDE EFFECTS OF WARFARIN?  Too much warfarin can cause bleeding (hemorrhage) from any part of the body. This may  include bleeding from the gums, blood in the urine, bloody or dark stools, a nosebleed that is not easily stopped, coughing up blood, or vomiting blood.  Too little warfarin can increase the risk of blood clots.  Too little or too much warfarin can also increase the risk of a stroke.  Warfarin use may cause a skin rash or irritation, an unusual fever, continual nausea or stomach upset, or severe pain in your joints or back. SPECIAL PRECAUTIONS WHILE TAKING WARFARIN Warfarin should be taken exactly as directed. It is very important to take warfarin as directed since bleeding or blood clots could result in chronic or permanent injury, pain, or disability.  Take your medicine at the same time every day. If you forget to take your dose, you can take it if it is within 6 hours of when it was due.  Do not change the dose of warfarin on your own to make up for missed or extra doses.  If you miss more than 2 doses in a row, you should contact your health care provider for advice. Avoid situations that cause bleeding. You may have a tendency to bleed more easily than usual while taking warfarin. The following actions can limit bleeding:  Using a softer toothbrush.  Flossing with waxed floss rather than unwaxed floss.  Shaving with an Neurosurgeon rather than a blade.  Limiting the use of sharp objects.  Avoiding potentially harmful activities, such as contact sports. Warfarin and Pregnancy or Breastfeeding  Warfarin is not advised during the first trimester of pregnancy due to an increased risk of birth defects. In certain situations, a woman may take warfarin after her first trimester of pregnancy. A woman who becomes pregnant or plans to become pregnant while taking warfarin should notify her health care provider immediately.  Although warfarin does not pass into breast milk, a woman who wishes to breastfeed while taking warfarin should also consult with her health care provider. Alcohol,  Smoking, and Illicit Drug Use  Alcohol affects how warfarin works in the body. It is best to avoid alcoholic drinks or consume very small amounts while taking warfarin. In general, alcohol intake should be limited to 1 oz (30 mL) of liquor, 6 oz (180 mL) of wine, or 12 oz (360 mL) of beer each day. Notify your health care provider if you change your alcohol intake.  Smoking affects how warfarin works. It is best to avoid smoking while taking warfarin. Notify your health care provider if you change your smoking habits.  It is best to avoid all illicit drugs while taking warfarin since there are few studies that show how warfarin interacts with these drugs. Other Medicines and Dietary Supplements Many prescription and over-the-counter medicines can interfere with warfarin. Be sure all of your health care providers know you are taking warfarin. Notify your health care provider who prescribed warfarin for you or your pharmacist before starting or stopping any new medicines, including over-the-counter vitamins, dietary supplements, and pain medicines. Your warfarin dose may need to be adjusted. Some common over-the-counter medicines  that may increase the risk of bleeding while taking warfarin include:   Acetaminophen.  Aspirin.  Nonsteroidal anti-inflammatory medicines (NSAIDs), such as ibuprofen or naproxen.  Vitamin E. Dietary Considerations  Foods that have moderate or high amounts of vitamin K can interfere with warfarin. Avoid major changes in your diet or notify your health care provider before changing your diet. Eat a consistent amount of foods that have moderate or high amounts of vitamin K. Eating less foods containing vitamin K can increase the risk of bleeding. Eating more foods containing vitamin K can increase the risk of blood clots. Additional questions about dietary considerations can be discussed with a dietitian. Foods that are very high in vitamin K:  Greens, such as Swiss chard  and beet, collard, mustard, or turnip greens (fresh or frozen, cooked).  Kale (fresh or frozen, cooked).  Parsley (raw).  Spinach (cooked). Foods that are high in vitamin K:  Asparagus (frozen, cooked).  Beans, green (frozen, cooked).  Broccoli.  Bok choy (cooked).  Brussels sprouts (fresh or frozen, cooked).  Cabbage (cooked).   Coleslaw. Foods that are moderately high in vitamin K:  Blueberries.  Black-eyed peas.  Endive (raw).  Green leaf lettuce (raw).  Green scallions (raw).  Kale (raw).  Okra (frozen, cooked).  Plantains (fried).  Romaine lettuce (raw).  Sauerkraut (canned).  Spinach (raw). CALL YOUR CLINIC OR HEALTH CARE PROVIDER IF YOU:  Plan to have any surgery or procedure.  Feel sick, especially if you have diarrhea or vomiting.  Experience or anticipate any major changes in your diet.  Start or stop a prescription or over-the-counter medicine.  Become, plan to become, or think you may be pregnant.  Are having heavier than usual menstrual periods.  Have had a fall, accident, or any symptoms of bleeding or unusual bruising.  Develop an unusual fever. CALL 911 IN THE [place] OR GO TO THE EMERGENCY DEPARTMENT IF YOU:   Think you may be having an allergic reaction to warfarin. The signs of an allergic reaction could include itching, rash, hives, swelling, chest tightness, or trouble breathing.  See signs of blood in your urine. The signs could include reddish, pinkish, or tea-colored urine.  See signs of blood in your stools. The signs could include bright red or black stools.  Vomit or cough up blood. In these instances, the blood could have either a bright red or a "coffee-grounds" appearance.  Have bleeding that will not stop after applying pressure for 30 minutes such as cuts, nosebleeds, or other injuries.  Have severe pain in your joints or back.  Have a new and severe headache.  Have sudden weakness or numbness of your face,  arm, or leg, especially on one side of your body.  Have sudden confusion or trouble understanding.  Have sudden trouble seeing in one or both eyes.  Have sudden trouble walking, dizziness, loss of balance, or coordination.  Have trouble speaking or understanding (aphasia). Document Released: 06/25/2005 Document Revised: 11/09/2013 Document Reviewed: 12/19/2012 [facility] Patient Information 2015 [place], [place]. This information is not intended to replace advice given to you by your health care provider. Make sure you discuss any questions you have with your health care provider.

## 2015-03-08 NOTE — Progress Notes (Addendum)
ANTICOAGULATION CONSULT NOTE - Follow Up Consult  Pharmacy Consult for Heparin Indication: VTE  No Known Allergies  Patient Measurements: Height:  (160 cm) Weight: 120 lb 13 oz (54.8 kg) IBW/kg (Calculated) : 52.4 Heparin Dosing Weight: 54.8 kg  Vital Signs: Temp: 98.3 F (36.8 C) (08/30 0558) Temp Source: Oral (08/30 0558) BP: 114/62 mmHg (08/30 0558) Pulse Rate: 50 (08/30 0558)  Labs:  Recent Labs  03/05/15 1342 03/05/15 1356 03/05/15 1948 03/06/15 0259 03/07/15 0222 03/07/15 1858 03/08/15 0301  HGB 12.1 13.9 11.6* 11.2* 9.4*  --  9.6*  HCT 38.8 41.0 35.3* 34.0* 29.0*  --  30.4*  PLT 179  --  167 149* 135*  --  147*  APTT 29  --   --   --   --   --   --   LABPROT 14.9  --  15.8*  --   --   --   --   INR 1.15  --  1.25  --   --   --   --   HEPARINUNFRC  --   --   --   --   --  0.51 0.61  CREATININE 1.95* 1.70* 1.53* 1.30*  --   --   --     Estimated Creatinine Clearance: 23.3 mL/min (by C-G formula based on Cr of 1.3).  Assessment: 81yo female presents from [facility]/27 as code stroke with pain in L arm. Pharmacy is consulted to dose heparin for VTE treatment. Pt has absent radial pulse in L arm. CBC is wnl, sCr 1.7, trop neg x1, INR 1.15  PMH: afib, HTN, hypothyroid  AC:  Heparin for VTE on admit (no anticoag pta). Left arm ischemia s/p left brachial embolectomy  Heparin stopped by vascular surgery due to bleeding at incision site. Per MD resident, Dr. Arbie Cookey recommended heparin 500 units/hr on 8/28 then start full dose anticoag 8/29 - Pharmacy re-consulted to dose heparin for afib. Hg 9.6, plt low 147. Likely to transition to Eliquis on 03/08/15 per MD note. Heparin level 0.61 in goal.  Goal of Therapy:  Heparin level 0.3-0.7 units/ml Monitor platelets by anticoagulation protocol: Yes   Plan:  - Continue IV heparin at 850 units/hr -F/u transition to oral AC when appropriate  Adden: Start Coumadin  po x 1 tonight. Daily INR  Rashanna Christiana S.  Merilynn Finland, PharmD, BCPS Clinical Staff Pharmacist Pager [phone]  Misty Stanley Stillinger 03/08/2015,11:08 AM

## 2015-03-08 NOTE — Progress Notes (Signed)
Patient ID: Stephanie Weiss, female   DOB: [birth year], 81 y.o.   MRN: [id number]   Subjective: Ms. Bassford said her pain is much-improved and she's feeling well, without complaints.  Dr. Bea Laura and I spoke to her son and daughter-in-law about pursuing anti-coagulation once she leaves. She was on Eliquis in the past but stopped taking it because it cost about $200 per month, so she started warfarin because it was $4 per month. About a year ago, she wanted to stop taking so many medications so her PCP discontinued the warfarin; I cleared up that she did not have a major brain or gastrointestinal bleed and she has had a more progressively cognitive decline since stopping the warfarin. Her son said he would think about re-starting Eliquis versus warfarin versus nothing and let me know this afternoon. I told him it was no rush because social work still had to help find her a SNF bed.  Objective: Vital signs in last 24 hours: Filed Vitals:   03/07/15 1200 03/07/15 1450 03/07/15 2119 03/08/15 0558  BP: 106/42 114/53 117/50 114/62  Pulse:  74 63 50  Temp:  98.4 F (36.9 C) 98.1 F (36.7 C) 98.3 F (36.8 C)  TempSrc:  Oral Oral Oral  Resp: Height:      Weight:      SpO2:    98%   General: resting in bed HEENT: waxy-appearing bags under her eyes Cardiac: irregular, no murmurs Pulm: clear anteriorly, breathing well Abd: soft, nontender, nondistended, BS present Ext: left arm bruising looks a little better today. Radial pulses 2+ bilaterally, moving both hands well  Lab Results: CBC:  Recent Labs Lab 03/05/15 1342  03/07/15 0222 03/08/15 0301  WBC 11.9*  < > 6.5 6.1  NEUTROABS 7.9*  --   --   --   HGB 12.1  < > 9.4* 9.6*  HCT 38.8  < > 29.0* 30.4*  MCV 95.6  < > 92.1 93.5  PLT 179  < > 135* 147*  < > = values in this interval not displayed.   Medications: I have reviewed the patient's current medications. Scheduled Meds: . acetaminophen  650 mg Oral TID  . amLODipine  10  mg Oral Daily  . Chlorhexidine Gluconate Cloth  6 each Topical Q0600  . furosemide  40 mg Oral Daily  . levothyroxine  125 mcg Oral QAC breakfast  . lisinopril  20 mg Oral Daily  . metoprolol succinate  50 mg Oral BID  . mupirocin ointment  1 application Nasal BID  . pantoprazole  40 mg Oral Daily  . risperiDONE  0.5 mg Oral BID  . sodium chloride  3 mL Intravenous Q12H   Continuous Infusions: . heparin 850 Units/hr (03/07/15 2157)   PRN Meds:.alum & mag hydroxide-simeth, hydrALAZINE, ondansetron, oxyCODONE, sodium chloride Assessment/Plan:  Left brachial arterial thrombus status-post emebolectomy 8/27: Her pain is well-controlled, she has some normal post-op ecchymoses, and her hemoglobin is stable at 9. Her son is thinking about anticoagulation options and I'll follow-up with them today. She is medically-cleared for discharge pending that decision and SNF placement -Heparin today per pharmacy -Anticoagulation depends on discussion with her son -Social work to see today  Dispo: Disposition is deferred at this time.  The patient does not know have a current PCP (No primary care provider on file.) and does need an [facility] hospital follow-up appointment after discharge.  The patient does have transportation limitations that hinder transportation to clinic appointments.  .Services  Needed at time of discharge: Y = Yes, Blank = No PT:   OT:   RN:   Equipment:   Other:     LOS: 3 days   Selina Cooley, MD 03/08/2015, 11:54 AM

## 2015-03-08 NOTE — Clinical Social Work Placement (Signed)
   CLINICAL SOCIAL WORK PLACEMENT  NOTE  Date:  03/08/2015  Patient Details  Name: Stephanie Weiss MRN: [id number] Date of Birth: Aug 29, 1920  Clinical Social Work is seeking post-discharge placement for this patient at the Skilled  Nursing Facility level of care (*CSW will initial, date and re-position this form in  chart as items are completed):  Yes   Patient/family provided with [facility] Clinical Social Work Department's list of facilities offering this level of care within the geographic area requested by the patient (or if unable, by the patient's family).  Yes   Patient/family informed of their freedom to choose among providers that offer the needed level of care, that participate in Medicare, Medicaid or managed care program needed by the patient, have an available bed and are willing to accept the patient.  Yes   Patient/family informed of 's ownership interest in [facility] and [facility], as well as of the fact that they are under no obligation to receive care at these facilities.  PASRR submitted to EDS on  (n/a)     PASRR number received on  (n/a)     Existing PASRR number confirmed on 03/08/15     FL2 transmitted to all facilities in geographic area requested by pt/family on 03/08/15     FL2 transmitted to all facilities within larger geographic area on  (n/a)     Patient informed that his/her managed care company has contracts with or will negotiate with certain facilities, including the following:   (yes, [facility])         Patient/family informed of bed offers received.  Patient chooses bed at       Physician recommends and patient chooses bed at      Patient to be transferred to   on  .  Patient to be transferred to facility by       Patient family notified on   of transfer.  Name of family member notified:        PHYSICIAN Please sign DNR, Please sign FL2     Additional Comment:     _______________________________________________ Rod Mae, LCSW 03/08/2015, 2:13 PM

## 2015-03-08 NOTE — Progress Notes (Signed)
  Date: 03/08/2015  Patient name: Stephanie Weiss  Medical record number: [id number]  Date of birth: 06-18-1921   This patient has been seen and the plan of care was discussed with the house staff. Please see their note for complete details. I concur with their findings with the following additions/corrections: Ms Goehring had no complaints today. She is pleasantly confused - thought her surgery was on the R arm (IV is wrapped with gauze). Ecchymosis on L a bit better. HgB stable on heparin drip. Dr Bea Laura, Dr Earnest Conroy, and I spoke to family and they have decided on warfarin for [facility]. For SNF once bed found.  Burns Spain, MD 03/08/2015, 4:04 PM

## 2015-03-08 NOTE — Progress Notes (Signed)
Physical Therapy Treatment Patient Details Name: Stephanie Weiss MRN: [id number] DOB: 1920/11/09 Today's Date: 03/08/2015    History of Present Illness 81 year old female with dementia, history of embolic strokes, chronic afib without anticoagulation, who presented with acute onset left arm pain and a cyanotic pulseless left arm, and was wheeled in for emergency brachial artery thrombectomy    PT Comments    Pt was able to be instructed on safe mobility with walker but might not retain this.  Have spoken to nursing to inform them of her situation with chair, and nursing will check on her after her dinner.  Pt is sitting in safe situation but may not be dependable due to her cognition.    Follow Up Recommendations  SNF     Equipment Recommendations  None recommended by PT    Recommendations for Other Services       Precautions / Restrictions Precautions Precautions: Fall Precaution Comments: alarm pad applied to sit up in chair Restrictions Weight Bearing Restrictions: No    Mobility  Bed Mobility Overal bed mobility: Needs Assistance Bed Mobility: Supine to Sit     Supine to sit: Min guard     General bed mobility comments: in chair on arrival  Transfers Overall transfer level: Needs assistance Equipment used: Rolling walker (2 wheeled) Transfers: Sit to/from [employer] Sit to Stand: Min guard;Min assist Stand pivot transfers: Min guard;Min assist       General transfer comment: reminders for safety and hand placement  Ambulation/Gait Ambulation/Gait assistance: Min assist Ambulation Distance (Feet): 20 Feet Assistive device: Rolling walker (2 wheeled);1 person hand held assist Gait Pattern/deviations: Step-through pattern;Narrow base of support;Shuffle;Trunk flexed Gait velocity: reduced Gait velocity interpretation: Below normal speed for age/gender     Stairs            Wheelchair Mobility    Modified Rankin (Stroke Patients  Only)       Balance Overall balance assessment: Needs assistance Sitting-balance support: Feet supported Sitting balance-Leahy Scale: Fair   Postural control: Posterior lean Standing balance support: Bilateral upper extremity supported Standing balance-Leahy Scale: Poor                      Cognition Arousal/Alertness: Awake/alert Behavior During Therapy: WFL for tasks assessed/performed Overall Cognitive Status: History of cognitive impairments - at baseline       Memory: Decreased recall of precautions;Decreased short-term memory              Exercises      General Comments General comments (skin integrity, edema, etc.): Pt is williing to get OOB but presents a real fall risk due to her memory.  Up in chair with family in and alarmpad, feet elevated for comfort and to avoid any tendency to stand      Pertinent Vitals/Pain Pain Assessment: No/denies pain    Home Living Family/patient expects to be discharged to:: Skilled nursing facility                    Prior Function      ADL's / Homemaking Assistance Needed: sponge bathes and dresses on her own with dgtr-in-lay laying out clothes every night, family does the cooking, housework and shopping     PT Goals (current goals can now be found in the care plan section) Acute Rehab PT Goals Patient Stated Goal: no goal verbalized Progress towards PT goals: Progressing toward goals    Frequency  Min 3X/week  PT Plan Current plan remains appropriate    Co-evaluation             End of Session   Activity Tolerance: Patient tolerated treatment well Patient left: in chair;with call bell/phone within reach;with chair alarm set;with family/visitor present     Time: 1610-9604 PT Time Calculation (min) (ACUTE ONLY): 29 min  Charges:  $Gait Training: 8-22 mins $Therapeutic Activity: 8-22 mins                    G Codes:      Ivar Drape 2015-04-06, 5:56 PM   Samul Dada, PT MS Acute  Rehab Dept. Number: [facility] [id number] and [facility] [phone]

## 2015-03-08 NOTE — Evaluation (Signed)
Occupational Therapy Evaluation Patient Details Name: Evonda Enge MRN: [id number] DOB: [birth year] Today's Date: 03/08/2015    History of Present Illness 81 year old female with dementia, history of embolic strokes, chronic afib without anticoagulation, who presented with acute onset left arm pain and a cyanotic pulseless left arm, and was wheeled in for emergency brachial artery thrombectomy   Clinical Impression   Patient evaluated by Occupational Therapy with no further acute OT needs identified. All further treatment to be deferred to [facility] level care. See below for any follow-up Occupational Therapy or equipment needs. OT to sign off. Thank you for referral.      Follow Up Recommendations  SNF    Equipment Recommendations  3 in 1 bedside comode    Recommendations for Other Services       Precautions / Restrictions Precautions Precautions: Fall Restrictions Weight Bearing Restrictions: No      Mobility Bed Mobility               General bed mobility comments: in chair on arrival  Transfers Overall transfer level: Needs assistance Equipment used: Rolling walker (2 wheeled) Transfers: Sit to/from Stand Sit to Stand: Min assist         General transfer comment: cues for safety with hand placement    Balance Overall balance assessment: Needs assistance         Standing balance support: Single extremity supported;During functional activity Standing balance-Leahy Scale: Poor                              ADL Overall ADL's : Needs assistance/impaired Eating/Feeding: Set up;Sitting   Grooming: Wash/dry hands;Minimal assistance;Standing Grooming Details (indicate cue type and reason): cues for sequence         Upper Body Dressing : Minimal assistance;Sitting Upper Body Dressing Details (indicate cue type and reason): (A) to Publishing copy Transfer: Minimal assistance;Ambulation;RW;BSC Toilet Transfer Details (indicate cue  type and reason): cues for safety Toileting- Clothing Manipulation and Hygiene: Minimal assistance;Sit to/from stand       Functional mobility during ADLs: Minimal assistance;Rolling walker General ADL Comments: pt incontinent in chair. Sitter in room verbalized currently on a toileting schedule. pt pleasant and happy to participate.      Vision     Perception     Praxis      Pertinent Vitals/Pain Pain Assessment: No/denies pain     Hand Dominance Right   Extremity/Trunk Assessment Upper Extremity Assessment Upper Extremity Assessment: Generalized weakness   Lower Extremity Assessment Lower Extremity Assessment: Overall WFL for tasks assessed   Cervical / Trunk Assessment Cervical / Trunk Assessment: Kyphotic   Communication Communication Communication: HOH   Cognition Arousal/Alertness: Awake/alert Behavior During Therapy: WFL for tasks assessed/performed Overall Cognitive Status: History of cognitive impairments - at baseline                     General Comments       Exercises       Shoulder Instructions      Home Living Family/patient expects to be discharged to:: Skilled nursing facility                                        Prior Functioning/Environment      ADL's / Homemaking Assistance Needed: sponge bathes and dresses on  her own with dgtr-in-lay laying out clothes every night, family does the cooking, housework and shopping        OT Diagnosis: Generalized weakness;Cognitive deficits   OT Problem List:     OT Treatment/Interventions:      OT Goals(Current goals can be found in the care plan section) Acute Rehab OT Goals Patient Stated Goal: no goal verbalized OT Goal Formulation: With patient Potential to Achieve Goals: Good  OT Frequency:     Barriers to D/C:            Co-evaluation              End of Session Equipment Utilized During Treatment: Gait belt;Rolling walker Nurse Communication:  Mobility status;Precautions  Activity Tolerance: Patient tolerated treatment well Patient left: in chair;with call bell/phone within reach;with nursing/sitter in room   Time: 1326-1346 OT Time Calculation (min): 20 min Charges:  OT General Charges $OT Visit: 1 Procedure OT Evaluation $Initial OT Evaluation Tier I: 1 Procedure G-Codes:    Harolyn Rutherford 03/12/2015, 3:06 PM   Mateo Flow   OTR/L Pager: [phone] Office: [phone] .

## 2015-03-08 NOTE — Clinical Social Work Note (Signed)
Clinical Social Work Assessment  Patient Details  Name: Stephanie Weiss MRN: [id number] Date of Birth: 1921-03-27  Date of referral:  03/08/15               Reason for consult:  Facility Placement, Discharge Planning                Permission sought to share information with:  Facility Medical sales representative, Family Supports Permission granted to share information::  Yes, Verbal Permission Granted  Name::     Jalina Blowers  Agency::  [facility]  Relationship::  Son  Contact Information:  [phone]  Housing/Transportation Living arrangements for the past 2 months:  Single Family Home Source of Information:  Adult Children Patient Interpreter Needed:  None Criminal Activity/Legal Involvement Pertinent to Current Situation/Hospitalization:  No - Comment as needed Significant Relationships:  Adult Children Lives with:  Adult Children Do you feel safe going back to the place where you live?  No (High fall risk.) Need for family participation in patient care:  Yes (Comment) (Patient's son active in patient's care.)  Care giving concerns:  Patient's family expressed no concerns at this time.    Social Worker assessment / plan:  CSW received referral for possible SNF placement at time of discharge. CSW spoke with patient's family regarding discharge disposition. Patient's son expressed understanding of PT recommendation for SNF placement and is agreeable to discharge plan to SNF. Patient's son stated preference for [facility] in [place]. CSW to continue to follow and assist with discharge planning needs.  Employment status:  Retired Health and safety inspector:  Medicare PT Recommendations:  Skilled Nursing Facility Information / Referral to community resources:  Skilled Nursing Facility  Patient/Family's Response to care:  Patient's family understanding and agreeable to CSW plan of care.  Patient/Family's Understanding of and Emotional Response to Diagnosis, Current  Treatment, and Prognosis:  Patient's family understanding and agreeable to CSW plan of care.  Emotional Assessment Appearance:  Appears stated age Attitude/Demeanor/Rapport:  Other (CSW spoke with patient's son as patient oriented to self only.) Affect (typically observed):  Other (CSW spoke with patient's son as patient oriented to self only.) Orientation:  Oriented to Self Alcohol / Substance use:  Not Applicable Psych involvement (Current and /or in the community):  No (Comment) (Not appropriate on this admission.)  Discharge Needs  Concerns to be addressed:  No discharge needs identified Readmission within the last 30 days:  No Current discharge risk:  None Barriers to Discharge:  No Barriers Identified   Rod Mae, LCSW 03/08/2015, 1:55 PM [phone]

## 2015-03-08 NOTE — Progress Notes (Signed)
I spoke to Stephanie Weiss and her son, Hessie Diener, and they said they would like to proceed with starting warfarin. They considered Eliquis but said it's too expensive at $200 per month. They understood that she will have to get frequent INR checks and they've been through the routine before. I've placed a consult with pharmacy to go ahead and start warfarin now.  I also spoke to Child psychotherapist, Irving Burton, who will drop by their room today to discuss SNF options.  Selina Cooley MD

## 2015-03-09 ENCOUNTER — Telehealth: Payer: Self-pay | Admitting: Vascular Surgery

## 2015-03-09 DIAGNOSIS — I519 Heart disease, unspecified: Secondary | ICD-10-CM | POA: Diagnosis not present

## 2015-03-09 DIAGNOSIS — M810 Age-related osteoporosis without current pathological fracture: Secondary | ICD-10-CM | POA: Diagnosis not present

## 2015-03-09 DIAGNOSIS — R278 Other lack of coordination: Secondary | ICD-10-CM | POA: Diagnosis not present

## 2015-03-09 DIAGNOSIS — Z7901 Long term (current) use of anticoagulants: Secondary | ICD-10-CM | POA: Diagnosis not present

## 2015-03-09 DIAGNOSIS — I639 Cerebral infarction, unspecified: Secondary | ICD-10-CM | POA: Diagnosis not present

## 2015-03-09 DIAGNOSIS — M6281 Muscle weakness (generalized): Secondary | ICD-10-CM | POA: Diagnosis not present

## 2015-03-09 DIAGNOSIS — R5383 Other fatigue: Secondary | ICD-10-CM | POA: Diagnosis not present

## 2015-03-09 DIAGNOSIS — E785 Hyperlipidemia, unspecified: Secondary | ICD-10-CM | POA: Diagnosis not present

## 2015-03-09 DIAGNOSIS — R488 Other symbolic dysfunctions: Secondary | ICD-10-CM | POA: Diagnosis not present

## 2015-03-09 DIAGNOSIS — R2681 Unsteadiness on feet: Secondary | ICD-10-CM | POA: Diagnosis not present

## 2015-03-09 DIAGNOSIS — R2689 Other abnormalities of gait and mobility: Secondary | ICD-10-CM | POA: Diagnosis not present

## 2015-03-09 DIAGNOSIS — Z4889 Encounter for other specified surgical aftercare: Secondary | ICD-10-CM | POA: Diagnosis not present

## 2015-03-09 DIAGNOSIS — Z9181 History of falling: Secondary | ICD-10-CM | POA: Diagnosis not present

## 2015-03-09 DIAGNOSIS — I482 Chronic atrial fibrillation: Secondary | ICD-10-CM | POA: Diagnosis not present

## 2015-03-09 DIAGNOSIS — F0150 Vascular dementia without behavioral disturbance: Secondary | ICD-10-CM | POA: Diagnosis not present

## 2015-03-09 DIAGNOSIS — I5030 Unspecified diastolic (congestive) heart failure: Secondary | ICD-10-CM | POA: Diagnosis not present

## 2015-03-09 LAB — PROTIME-INR
INR: 1.22 (ref 0.00–1.49)
Prothrombin Time: 15.6 seconds — ABNORMAL HIGH (ref 11.6–15.2)

## 2015-03-09 LAB — HEPARIN LEVEL (UNFRACTIONATED): Heparin Unfractionated: 0.7 IU/mL (ref 0.30–0.70)

## 2015-03-09 MED ORDER — RISPERIDONE 0.5 MG PO TABS: 0.5000 mg | ORAL_TABLET | Freq: Two times a day (BID) | ORAL | Status: AC

## 2015-03-09 MED ORDER — WARFARIN SODIUM 4 MG PO TABS
4.0000 mg | ORAL_TABLET | Freq: Once | ORAL | Status: DC
  Filled 2015-03-09: qty 1

## 2015-03-09 MED ORDER — ENOXAPARIN SODIUM 30 MG/0.3ML ~~LOC~~ SOLN: 55.0000 mg | SUBCUTANEOUS | Status: AC

## 2015-03-09 MED ORDER — WARFARIN SODIUM 4 MG PO TABS: 4.0000 mg | ORAL_TABLET | Freq: Every day | ORAL | Status: AC

## 2015-03-09 NOTE — Discharge Planning (Signed)
Patient to be discharged to [facility]. Patient's son, Hessie Diener, updated regarding discharge.  Facility: [facility] (Room 101-A) RN report number: [phone] Transportation: Patient's son to transport  Marcelline Deist, [place] - [phone] Clinical Social Work Department Orthopedics [phone]) and Surgical [phone])

## 2015-03-09 NOTE — Progress Notes (Signed)
Report called to RN at [facility]. Discharged by wheelchair, accompanied by family members.

## 2015-03-09 NOTE — Progress Notes (Signed)
ANTICOAGULATION CONSULT NOTE - Follow Up Consult  Pharmacy Consult for Heparin/Coumadin Indication: VTE, afib  No Known Allergies  Patient Measurements: Height:  (160 cm) Weight: 120 lb 13 oz (54.8 kg) IBW/kg (Calculated) : 52.4 Heparin Dosing Weight: 54.8 kg  Vital Signs: Temp: 97.9 F (36.6 C) (08/31 0611) Temp Source: Oral (08/31 0611) BP: 114/58 mmHg (08/31 0611) Pulse Rate: 60 (08/31 0611)  Labs:  Recent Labs  03/07/15 0222 03/07/15 1858 03/08/15 0301 03/09/15 0334  HGB 9.4*  --  9.6*  --   HCT 29.0*  --  30.4*  --   PLT 135*  --  147*  --   LABPROT  --   --   --  15.6*  INR  --   --   --  1.22  HEPARINUNFRC  --  0.51 0.61 0.70    Estimated Creatinine Clearance: 23.3 mL/min (by C-G formula based on Cr of 1.3).  Assessment: 81yo female [facility]/27 as code stroke with pain in L arm. Pharmacy is consulted to dose heparin for VTE treatment. Pt has absent radial pulse in L arm. 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 Initially had some bleeding from incision site-resolved. Heparin level 0.7. INR 1.25>1.22. No CBC today.  Goal of Therapy:  Heparin level 0.3-0.7 units/ml Monitor platelets by anticoagulation protocol: Yes   Plan:  - Continue IV heparin at 850 units/hr>>plan change to Lovenox  ONCE daily at discharge to SNF. - Coumadin  po x 1 tonight. - Daily HL, CBC and INR  Stephanie Weiss S. Merilynn Finland, PharmD, BCPS Clinical Staff Pharmacist Pager [phone]  Stephanie Weiss 03/09/2015,10:25 AM

## 2015-03-09 NOTE — Telephone Encounter (Addendum)
-----   Message from Sharee Pimple, RN sent at 03/09/2015 10:52 AM EDT ----- Regarding: Schedule   ----- Message -----    From: Dara Lords, PA-C    Sent: 03/09/2015   8:20 AM      To: Vvs Charge Pool  S/p left brachial embolectomy.  F/u with Dr. Arbie Cookey in 2 weeks.  Thanks, Lelon Mast  notified patient's son of post op appt. with dr. early on 03-29-15 1:15

## 2015-03-09 NOTE — Progress Notes (Signed)
Patient ID: Stephanie Weiss, female   DOB: Mar 28, 1921, 81 y.o.   MRN: [id number]   Subjective: Stephanie Weiss was in high spirits this morning and was quite cogent. She said her left arm was a little sore but it wasn't bothering too bad. She was looking forward to getting her strength back once she leaves the hospital. She had no other complaints.  Objective: Vital signs in last 24 hours: Filed Vitals:   03/08/15 0558 03/08/15 1400 03/08/15 2122 03/09/15 0611  BP: 114/62 114/58 103/45 114/58  Pulse: 50 62 64 60  Temp: 98.3 F (36.8 C) 98.3 F (36.8 C) 98.2 F (36.8 C) 97.9 F (36.6 C)  TempSrc: Oral Oral Oral Oral  Resp: 16 17 16 18   Height:      Weight:      SpO2: 98% 98% 96% 95%   General: resting in bed HEENT: waxy-appearing bags under her eyes. No glossitis or plaques on her volar fingertips. Cardiac: irregular, no murmurs Pulm: clear anteriorly, breathing well Abd: soft, nontender, nondistended, BS present Ext: left arm bruising looks a little better today. Radial pulses 2+ bilaterally, moving both hands well as she eats breakfast  Lab Results: Basic Metabolic Panel:  Recent Labs Lab 03/05/15 1948 03/06/15 0259  NA 139 139  K 4.5 4.3  CL 103 105  CO2 22 23  GLUCOSE 160* 136*  BUN 40* 35*  CREATININE 1.53* 1.30*  CALCIUM 8.8* 8.9    Coagulation:  Recent Labs Lab 03/05/15 1342 03/05/15 1948 03/09/15 0334  LABPROT 14.9 15.8* 15.6*  INR 1.15 1.25 1.22   Medications: I have reviewed the patient's current medications. Scheduled Meds: . acetaminophen  650 mg Oral TID  . amLODipine  10 mg Oral Daily  . Chlorhexidine Gluconate Cloth  6 each Topical Q0600  . furosemide  40 mg Oral Daily  . levothyroxine  125 mcg Oral QAC breakfast  . lisinopril  20 mg Oral Daily  . metoprolol succinate  50 mg Oral BID  . mupirocin ointment  1 application Nasal BID  . pantoprazole  40 mg Oral Daily  . risperiDONE  0.5 mg Oral BID  . sodium chloride  3 mL Intravenous Q12H  .  Warfarin - Pharmacist Dosing Inpatient   Does not apply q1800   Continuous Infusions: . heparin 850 Units/hr (03/09/15 0601)   PRN Meds:.alum & mag hydroxide-simeth, hydrALAZINE, ondansetron, oxyCODONE, sodium chloride Assessment/Plan:   Left brachial arterial thrombus status-post emebolectomy 8/27: Her pain is well-controlled, she has some normal post-op ecchymoses, and her hemoglobin is stable. After a long discussion yesterday the patient and her son decided to pursue warfarin as an outpatient. I'll ensure that [facility] can deliver enoxaparin injections and follow INRs to bridge her until her INR becomes therapeutic. She is medically-cleared for discharge. -Heparin today per pharmacy -Warfarin per pharmacy -Will discharge home with warfarin and 1 week enoxaparin bridge -Discharge to SNF today; I will put a formal palliative care consult in the discharge summary per their note. Thank you for your recommendations  Dispo: SNF today  The patient does not have a current PCP (No primary care provider on file.) and does not need an [facility] hospital follow-up appointment after discharge.  The patient does not have transportation limitations that hinder transportation to clinic appointments.  .Services Needed at time of discharge: Y = Yes, Blank = No PT:   OT:   RN:   Equipment:   Other:     LOS: 4 days   Selina Cooley,  MD 03/09/2015, 8:35 AM

## 2015-03-09 NOTE — Progress Notes (Signed)
  Progress Note    03/09/2015 8:16 AM 4 Days Post-Op  Subjective:  States she's not having any pain but just soreness left arm  afebrile  Filed Vitals:   03/09/15 0611  BP: 114/58  Pulse: 60  Temp: 97.9 F (36.6 C)  Resp: 18    Physical Exam: Lungs:  Non labored Incisions:  C/d/i with steri strips in place  Extremities:  Ecchymosis left arm around incision; 2+ palpable left radial pulse   CBC    Component Value Date/Time   WBC 6.1 03/08/2015 0301   RBC 3.25* 03/08/2015 0301   HGB 9.6* 03/08/2015 0301   HCT 30.4* 03/08/2015 0301   PLT 147* 03/08/2015 0301   MCV 93.5 03/08/2015 0301   MCH 29.5 03/08/2015 0301   MCHC 31.6 03/08/2015 0301   RDW 14.2 03/08/2015 0301   LYMPHSABS 2.9 03/05/2015 1342   MONOABS 1.1* 03/05/2015 1342   EOSABS 0.1 03/05/2015 1342   BASOSABS 0.0 03/05/2015 1342    BMET    Component Value Date/Time   NA 139 03/06/2015 0259   K 4.3 03/06/2015 0259   CL 105 03/06/2015 0259   CO2 23 03/06/2015 0259   GLUCOSE 136* 03/06/2015 0259   BUN 35* 03/06/2015 0259   CREATININE 1.30* 03/06/2015 0259   CALCIUM 8.9 03/06/2015 0259   GFRNONAA 35* 03/06/2015 0259   GFRAA 40* 03/06/2015 0259    INR    Component Value Date/Time   INR 1.22 03/09/2015 0334     Intake/Output Summary (Last 24 hours) at 03/09/15 0816 Last data filed at 03/08/15 1831  Gross per 24 hour  Intake    840 ml  Output      0 ml  Net    840 ml     Assessment:  81 y.o. female is s/p:  Left brachial embolectomy  4 Days Post-Op  Plan: -pt doing well this am with palpable left radial pulse -DVT prophylaxis:  Heparin with bridge to coumadin -hgb stable -f/u with Dr. Arbie Cookey in 2 weeks   Stephanie Massed, PA-C [facility] [phone] 03/09/2015 8:16 AM

## 2015-03-09 NOTE — Progress Notes (Signed)
Daily Progress Note   Patient Name: Stephanie Weiss       Date: 03/09/2015 DOB: 05-Jul-1921  Age: 81 y.o. MRN#: [id number] Attending Physician: Aletta Edouard, MD Primary Care Physician: No primary care provider on file. Admit Date: 03/05/2015  Reason for Consultation/Follow-up: Establishing goals of care  Subjective: Much better today. Calmer and less agitated.  Interval Events: PMT consulted 8/30  Length of Stay: 4 days  Current Medications: Scheduled Meds:  . acetaminophen  650 mg Oral TID  . amLODipine  10 mg Oral Daily  . Chlorhexidine Gluconate Cloth  6 each Topical Q0600  . furosemide  40 mg Oral Daily  . levothyroxine  125 mcg Oral QAC breakfast  . lisinopril  20 mg Oral Daily  . metoprolol succinate  50 mg Oral BID  . mupirocin ointment  1 application Nasal BID  . pantoprazole  40 mg Oral Daily  . risperiDONE  0.5 mg Oral BID  . sodium chloride  3 mL Intravenous Q12H  . Warfarin - Pharmacist Dosing Inpatient   Does not apply q1800    Continuous Infusions: . heparin 850 Units/hr (03/07/15 2157)    PRN Meds: alum & mag hydroxide-simeth, hydrALAZINE, ondansetron, oxyCODONE, sodium chloride  Palliative Performance Scale: 60 %     Vital Signs: BP 103/45 mmHg  Pulse 64  Temp(Src) 98.2 F (36.8 C) (Oral)  Resp 16  Ht 5\' 3"  (1.6 m)  Wt 54.8 kg (120 lb 13 oz)  BMI 21.41 kg/m2  SpO2 96% SpO2: SpO2: 96 % O2 Device: O2 Device: Not Delivered O2 Flow Rate: O2 Flow Rate (L/min): 2 L/min  Intake/output summary:  Intake/Output Summary (Last 24 hours) at 03/09/15 0058 Last data filed at 03/08/15 1831  Gross per 24 hour  Intake    840 ml  Output      0 ml  Net    840 ml   LBM:   Baseline Weight: Weight: 54.999 kg (121 lb 4 oz) Most recent weight: Weight: 54.8 kg (120 lb 13 oz)  Physical Exam: OOB sitting in chair. Pleasant but confused. Decreased edema in left arm.          Additional Data Reviewed: Recent Labs     03/06/15  0259  03/07/15  0222   03/08/15  0301  WBC  9.5  6.5  6.1  HGB  11.2*  9.4*  9.6*  PLT  149*  135*  147*  NA  139   --    --   BUN  35*   --    --   CREATININE  1.30*   --    --      Problem List:  Patient Active Problem List   Diagnosis Date Noted  . Acute delirium 03/07/2015  . Vascular dementia 03/07/2015  . Anticoagulated 03/07/2015  . Recurrent falls 03/07/2015  . DNR (do not resuscitate) 03/07/2015  . Brachial artery embolus 03/05/2015  . AKI (acute kidney injury) 03/05/2015  . Atrial fibrillation 03/05/2015     Palliative Care Assessment & Plan    Code Status:  DNR  Goals of Care:  Treat reversible medical problems that do not involve extensive for overly invasive procedures. I talked at length with family yesterday about the disease trajectory of vascular dementia- they desire to allow for a natural death to occur in the event of a very acute or life threatening illness.  Avoid hospitalization  She wants to return home, her safety however is a major concern- especially that she  is now on anticoagulation.  RECOMMENDATIONS FOR DISCHARGE AND CARE TRANSITION:  Continue BID Risperdal  Continue scheduled Tylenol at SNF TID during rehab phase of care  Fall precautions  Delirium Precautions (avoid overly sedating medications or anticholinergic medication)   Provide family with caregiver resources- they are extremely burned out and frustrated with dealing with the issues surrounding dementia care- please give them information on [facility] caregiver programs   Please specifically request Palliative Services referral to be placed at SNF to follow in case of an acute decline or there are additional needs    Care plan was discussed with patient's son.  Thank you for allowing the Palliative Medicine Team to assist in the care of this patient.  Time: 10AM-1030AM  Greater than 50%  of this time was spent counseling and coordinating care related to the above  assessment and plan.   Edsel Petrin, DO  03/09/2015, 12:58 AM  Please contact Palliative Medicine Team phone at [phone] for questions and concerns.

## 2015-03-09 NOTE — Clinical Social Work Placement (Signed)
   CLINICAL SOCIAL WORK PLACEMENT  NOTE  Date:  03/09/2015  Patient Details  Name: Stephanie Weiss MRN: [id number] Date of Birth: 14-Dec-1920  Clinical Social Work is seeking post-discharge placement for this patient at the Skilled  Nursing Facility level of care (*CSW will initial, date and re-position this form in  chart as items are completed):  Yes   Patient/family provided with [facility] Clinical Social Work Department's list of facilities offering this level of care within the geographic area requested by the patient (or if unable, by the patient's family).  Yes   Patient/family informed of their freedom to choose among providers that offer the needed level of care, that participate in Medicare, Medicaid or managed care program needed by the patient, have an available bed and are willing to accept the patient.  Yes   Patient/family informed of [facility]'s ownership interest in [facility] and [facility], as well as of the fact that they are under no obligation to receive care at these facilities.  PASRR submitted to EDS on  (n/a)     PASRR number received on  (n/a)     Existing PASRR number confirmed on 03/08/15     FL2 transmitted to all facilities in geographic area requested by pt/family on 03/08/15     FL2 transmitted to all facilities within larger geographic area on  (n/a)     Patient informed that his/her managed care company has contracts with or will negotiate with certain facilities, including the following:   (yes, [facility])     Yes   Patient/family informed of bed offers received.  Patient chooses bed at [facility]     Physician recommends and patient chooses bed at  (n/a)    Patient to be transferred to [facility] on 03/09/15.  Patient to be transferred to facility by Patient's son, Hessie Diener     Patient family notified on 03/09/15 of transfer.  Name of family member notified:  Patient's son, Hessie Diener      PHYSICIAN       Additional Comment:    _______________________________________________ Rod Mae, LCSW 03/09/2015, 11:28 AM

## 2015-03-09 NOTE — Progress Notes (Signed)
  Date: 03/09/2015  Patient name: Stephanie Weiss  Medical record number: [id number]  Date of birth: February 07, 1921   This patient has been seen and the plan of care was discussed with the house staff. Please see their note for complete details. I concur with their findings with the following additions/corrections: Ms Kushnir remains quite happy today. Does admit that her L arm is sore when asked. Ready to get out of hospital to rehab. Pt is now on [facility] for arterial clot and A Fib.  Burns Spain, MD 03/09/2015, 2:44 PM

## 2015-03-15 DIAGNOSIS — Z7901 Long term (current) use of anticoagulants: Secondary | ICD-10-CM | POA: Diagnosis not present

## 2015-03-28 ENCOUNTER — Encounter: Payer: Self-pay | Admitting: Vascular Surgery

## 2015-03-29 ENCOUNTER — Ambulatory Visit: Payer: Medicare Other | Admitting: Vascular Surgery

## 2015-04-09 DIAGNOSIS — Z7983 Long term (current) use of bisphosphonates: Secondary | ICD-10-CM | POA: Diagnosis not present

## 2015-04-09 DIAGNOSIS — Z7982 Long term (current) use of aspirin: Secondary | ICD-10-CM | POA: Diagnosis not present

## 2015-04-09 DIAGNOSIS — Z5181 Encounter for therapeutic drug level monitoring: Secondary | ICD-10-CM | POA: Diagnosis not present

## 2015-04-09 DIAGNOSIS — Z7901 Long term (current) use of anticoagulants: Secondary | ICD-10-CM | POA: Diagnosis not present

## 2015-04-09 DIAGNOSIS — R269 Unspecified abnormalities of gait and mobility: Secondary | ICD-10-CM | POA: Diagnosis not present

## 2015-04-11 DIAGNOSIS — Z5181 Encounter for therapeutic drug level monitoring: Secondary | ICD-10-CM | POA: Diagnosis not present

## 2015-04-11 DIAGNOSIS — Z7983 Long term (current) use of bisphosphonates: Secondary | ICD-10-CM | POA: Diagnosis not present

## 2015-04-11 DIAGNOSIS — R269 Unspecified abnormalities of gait and mobility: Secondary | ICD-10-CM | POA: Diagnosis not present

## 2015-04-11 DIAGNOSIS — Z7901 Long term (current) use of anticoagulants: Secondary | ICD-10-CM | POA: Diagnosis not present

## 2015-04-11 DIAGNOSIS — Z7982 Long term (current) use of aspirin: Secondary | ICD-10-CM | POA: Diagnosis not present

## 2015-04-11 DIAGNOSIS — I481 Persistent atrial fibrillation: Secondary | ICD-10-CM | POA: Diagnosis not present

## 2015-04-13 DIAGNOSIS — Z7982 Long term (current) use of aspirin: Secondary | ICD-10-CM | POA: Diagnosis not present

## 2015-04-13 DIAGNOSIS — Z7901 Long term (current) use of anticoagulants: Secondary | ICD-10-CM | POA: Diagnosis not present

## 2015-04-13 DIAGNOSIS — Z7983 Long term (current) use of bisphosphonates: Secondary | ICD-10-CM | POA: Diagnosis not present

## 2015-04-13 DIAGNOSIS — R269 Unspecified abnormalities of gait and mobility: Secondary | ICD-10-CM | POA: Diagnosis not present

## 2015-04-13 DIAGNOSIS — Z5181 Encounter for therapeutic drug level monitoring: Secondary | ICD-10-CM | POA: Diagnosis not present

## 2015-04-14 DIAGNOSIS — Z7901 Long term (current) use of anticoagulants: Secondary | ICD-10-CM | POA: Diagnosis not present

## 2015-04-14 DIAGNOSIS — Z7983 Long term (current) use of bisphosphonates: Secondary | ICD-10-CM | POA: Diagnosis not present

## 2015-04-14 DIAGNOSIS — Z5181 Encounter for therapeutic drug level monitoring: Secondary | ICD-10-CM | POA: Diagnosis not present

## 2015-04-14 DIAGNOSIS — Z7982 Long term (current) use of aspirin: Secondary | ICD-10-CM | POA: Diagnosis not present

## 2015-04-14 DIAGNOSIS — R269 Unspecified abnormalities of gait and mobility: Secondary | ICD-10-CM | POA: Diagnosis not present

## 2015-04-19 DIAGNOSIS — Z7901 Long term (current) use of anticoagulants: Secondary | ICD-10-CM | POA: Diagnosis not present

## 2015-04-19 DIAGNOSIS — Z7983 Long term (current) use of bisphosphonates: Secondary | ICD-10-CM | POA: Diagnosis not present

## 2015-04-19 DIAGNOSIS — Z7982 Long term (current) use of aspirin: Secondary | ICD-10-CM | POA: Diagnosis not present

## 2015-04-19 DIAGNOSIS — R269 Unspecified abnormalities of gait and mobility: Secondary | ICD-10-CM | POA: Diagnosis not present

## 2015-04-19 DIAGNOSIS — Z5181 Encounter for therapeutic drug level monitoring: Secondary | ICD-10-CM | POA: Diagnosis not present

## 2015-04-22 DIAGNOSIS — Z7983 Long term (current) use of bisphosphonates: Secondary | ICD-10-CM | POA: Diagnosis not present

## 2015-04-22 DIAGNOSIS — Z7901 Long term (current) use of anticoagulants: Secondary | ICD-10-CM | POA: Diagnosis not present

## 2015-04-22 DIAGNOSIS — Z5181 Encounter for therapeutic drug level monitoring: Secondary | ICD-10-CM | POA: Diagnosis not present

## 2015-04-22 DIAGNOSIS — Z7982 Long term (current) use of aspirin: Secondary | ICD-10-CM | POA: Diagnosis not present

## 2015-04-22 DIAGNOSIS — R269 Unspecified abnormalities of gait and mobility: Secondary | ICD-10-CM | POA: Diagnosis not present

## 2015-04-25 DIAGNOSIS — R269 Unspecified abnormalities of gait and mobility: Secondary | ICD-10-CM | POA: Diagnosis not present

## 2015-04-25 DIAGNOSIS — Z7982 Long term (current) use of aspirin: Secondary | ICD-10-CM | POA: Diagnosis not present

## 2015-04-25 DIAGNOSIS — Z5181 Encounter for therapeutic drug level monitoring: Secondary | ICD-10-CM | POA: Diagnosis not present

## 2015-04-25 DIAGNOSIS — Z7983 Long term (current) use of bisphosphonates: Secondary | ICD-10-CM | POA: Diagnosis not present

## 2015-04-25 DIAGNOSIS — Z7901 Long term (current) use of anticoagulants: Secondary | ICD-10-CM | POA: Diagnosis not present

## 2015-04-26 DIAGNOSIS — R269 Unspecified abnormalities of gait and mobility: Secondary | ICD-10-CM | POA: Diagnosis not present

## 2015-04-26 DIAGNOSIS — Z7983 Long term (current) use of bisphosphonates: Secondary | ICD-10-CM | POA: Diagnosis not present

## 2015-04-26 DIAGNOSIS — Z7901 Long term (current) use of anticoagulants: Secondary | ICD-10-CM | POA: Diagnosis not present

## 2015-04-26 DIAGNOSIS — Z7982 Long term (current) use of aspirin: Secondary | ICD-10-CM | POA: Diagnosis not present

## 2015-04-26 DIAGNOSIS — Z5181 Encounter for therapeutic drug level monitoring: Secondary | ICD-10-CM | POA: Diagnosis not present

## 2015-04-28 DIAGNOSIS — Z23 Encounter for immunization: Secondary | ICD-10-CM | POA: Diagnosis not present

## 2015-04-28 DIAGNOSIS — Z7901 Long term (current) use of anticoagulants: Secondary | ICD-10-CM | POA: Diagnosis not present

## 2015-04-28 DIAGNOSIS — R269 Unspecified abnormalities of gait and mobility: Secondary | ICD-10-CM | POA: Diagnosis not present

## 2015-04-28 DIAGNOSIS — Z7983 Long term (current) use of bisphosphonates: Secondary | ICD-10-CM | POA: Diagnosis not present

## 2015-04-28 DIAGNOSIS — Z5181 Encounter for therapeutic drug level monitoring: Secondary | ICD-10-CM | POA: Diagnosis not present

## 2015-04-28 DIAGNOSIS — Z7982 Long term (current) use of aspirin: Secondary | ICD-10-CM | POA: Diagnosis not present

## 2015-04-29 DIAGNOSIS — Z7901 Long term (current) use of anticoagulants: Secondary | ICD-10-CM | POA: Diagnosis not present

## 2015-04-29 DIAGNOSIS — Z7982 Long term (current) use of aspirin: Secondary | ICD-10-CM | POA: Diagnosis not present

## 2015-04-29 DIAGNOSIS — Z7983 Long term (current) use of bisphosphonates: Secondary | ICD-10-CM | POA: Diagnosis not present

## 2015-04-29 DIAGNOSIS — Z5181 Encounter for therapeutic drug level monitoring: Secondary | ICD-10-CM | POA: Diagnosis not present

## 2015-04-29 DIAGNOSIS — R269 Unspecified abnormalities of gait and mobility: Secondary | ICD-10-CM | POA: Diagnosis not present

## 2015-05-02 DIAGNOSIS — Z7982 Long term (current) use of aspirin: Secondary | ICD-10-CM | POA: Diagnosis not present

## 2015-05-02 DIAGNOSIS — R918 Other nonspecific abnormal finding of lung field: Secondary | ICD-10-CM | POA: Diagnosis not present

## 2015-05-02 DIAGNOSIS — M79669 Pain in unspecified lower leg: Secondary | ICD-10-CM | POA: Diagnosis not present

## 2015-05-02 DIAGNOSIS — Z7901 Long term (current) use of anticoagulants: Secondary | ICD-10-CM | POA: Diagnosis not present

## 2015-05-02 DIAGNOSIS — Z7983 Long term (current) use of bisphosphonates: Secondary | ICD-10-CM | POA: Diagnosis not present

## 2015-05-02 DIAGNOSIS — Z5181 Encounter for therapeutic drug level monitoring: Secondary | ICD-10-CM | POA: Diagnosis not present

## 2015-05-02 DIAGNOSIS — R600 Localized edema: Secondary | ICD-10-CM | POA: Diagnosis not present

## 2015-05-02 DIAGNOSIS — R269 Unspecified abnormalities of gait and mobility: Secondary | ICD-10-CM | POA: Diagnosis not present

## 2015-05-03 DIAGNOSIS — Z5181 Encounter for therapeutic drug level monitoring: Secondary | ICD-10-CM | POA: Diagnosis not present

## 2015-05-03 DIAGNOSIS — Z7983 Long term (current) use of bisphosphonates: Secondary | ICD-10-CM | POA: Diagnosis not present

## 2015-05-03 DIAGNOSIS — R269 Unspecified abnormalities of gait and mobility: Secondary | ICD-10-CM | POA: Diagnosis not present

## 2015-05-03 DIAGNOSIS — F0150 Vascular dementia without behavioral disturbance: Secondary | ICD-10-CM | POA: Diagnosis not present

## 2015-05-03 DIAGNOSIS — I481 Persistent atrial fibrillation: Secondary | ICD-10-CM | POA: Diagnosis not present

## 2015-05-03 DIAGNOSIS — R2689 Other abnormalities of gait and mobility: Secondary | ICD-10-CM | POA: Diagnosis not present

## 2015-05-03 DIAGNOSIS — Z7982 Long term (current) use of aspirin: Secondary | ICD-10-CM | POA: Diagnosis not present

## 2015-05-03 DIAGNOSIS — I509 Heart failure, unspecified: Secondary | ICD-10-CM | POA: Diagnosis not present

## 2015-05-03 DIAGNOSIS — M1990 Unspecified osteoarthritis, unspecified site: Secondary | ICD-10-CM | POA: Diagnosis not present

## 2015-05-03 DIAGNOSIS — Z7901 Long term (current) use of anticoagulants: Secondary | ICD-10-CM | POA: Diagnosis not present

## 2015-05-06 DIAGNOSIS — Z5181 Encounter for therapeutic drug level monitoring: Secondary | ICD-10-CM | POA: Diagnosis not present

## 2015-05-06 DIAGNOSIS — R269 Unspecified abnormalities of gait and mobility: Secondary | ICD-10-CM | POA: Diagnosis not present

## 2015-05-06 DIAGNOSIS — Z7983 Long term (current) use of bisphosphonates: Secondary | ICD-10-CM | POA: Diagnosis not present

## 2015-05-06 DIAGNOSIS — Z7901 Long term (current) use of anticoagulants: Secondary | ICD-10-CM | POA: Diagnosis not present

## 2015-05-06 DIAGNOSIS — Z7982 Long term (current) use of aspirin: Secondary | ICD-10-CM | POA: Diagnosis not present

## 2015-05-09 DIAGNOSIS — Z7901 Long term (current) use of anticoagulants: Secondary | ICD-10-CM | POA: Diagnosis not present

## 2015-05-09 DIAGNOSIS — Z7982 Long term (current) use of aspirin: Secondary | ICD-10-CM | POA: Diagnosis not present

## 2015-05-09 DIAGNOSIS — R269 Unspecified abnormalities of gait and mobility: Secondary | ICD-10-CM | POA: Diagnosis not present

## 2015-05-09 DIAGNOSIS — Z5181 Encounter for therapeutic drug level monitoring: Secondary | ICD-10-CM | POA: Diagnosis not present

## 2015-05-09 DIAGNOSIS — Z7983 Long term (current) use of bisphosphonates: Secondary | ICD-10-CM | POA: Diagnosis not present

## 2015-05-10 DIAGNOSIS — Z7982 Long term (current) use of aspirin: Secondary | ICD-10-CM | POA: Diagnosis not present

## 2015-05-10 DIAGNOSIS — Z7983 Long term (current) use of bisphosphonates: Secondary | ICD-10-CM | POA: Diagnosis not present

## 2015-05-10 DIAGNOSIS — Z7901 Long term (current) use of anticoagulants: Secondary | ICD-10-CM | POA: Diagnosis not present

## 2015-05-10 DIAGNOSIS — R269 Unspecified abnormalities of gait and mobility: Secondary | ICD-10-CM | POA: Diagnosis not present

## 2015-05-10 DIAGNOSIS — Z5181 Encounter for therapeutic drug level monitoring: Secondary | ICD-10-CM | POA: Diagnosis not present

## 2015-05-11 DIAGNOSIS — Z5181 Encounter for therapeutic drug level monitoring: Secondary | ICD-10-CM | POA: Diagnosis not present

## 2015-05-11 DIAGNOSIS — Z7982 Long term (current) use of aspirin: Secondary | ICD-10-CM | POA: Diagnosis not present

## 2015-05-11 DIAGNOSIS — R269 Unspecified abnormalities of gait and mobility: Secondary | ICD-10-CM | POA: Diagnosis not present

## 2015-05-11 DIAGNOSIS — Z7901 Long term (current) use of anticoagulants: Secondary | ICD-10-CM | POA: Diagnosis not present

## 2015-05-11 DIAGNOSIS — Z7983 Long term (current) use of bisphosphonates: Secondary | ICD-10-CM | POA: Diagnosis not present

## 2015-05-16 DIAGNOSIS — I5030 Unspecified diastolic (congestive) heart failure: Secondary | ICD-10-CM | POA: Diagnosis not present

## 2015-05-16 DIAGNOSIS — R0602 Shortness of breath: Secondary | ICD-10-CM | POA: Diagnosis not present

## 2015-05-16 DIAGNOSIS — J439 Emphysema, unspecified: Secondary | ICD-10-CM | POA: Diagnosis not present

## 2015-05-17 DIAGNOSIS — A0222 Salmonella pneumonia: Secondary | ICD-10-CM | POA: Diagnosis not present

## 2015-05-17 DIAGNOSIS — Z7901 Long term (current) use of anticoagulants: Secondary | ICD-10-CM | POA: Diagnosis not present

## 2015-05-17 DIAGNOSIS — Z7983 Long term (current) use of bisphosphonates: Secondary | ICD-10-CM | POA: Diagnosis not present

## 2015-05-17 DIAGNOSIS — Z5181 Encounter for therapeutic drug level monitoring: Secondary | ICD-10-CM | POA: Diagnosis not present

## 2015-05-17 DIAGNOSIS — Z7982 Long term (current) use of aspirin: Secondary | ICD-10-CM | POA: Diagnosis not present

## 2015-05-17 DIAGNOSIS — R269 Unspecified abnormalities of gait and mobility: Secondary | ICD-10-CM | POA: Diagnosis not present

## 2015-05-19 DIAGNOSIS — Z7901 Long term (current) use of anticoagulants: Secondary | ICD-10-CM | POA: Diagnosis not present

## 2015-05-19 DIAGNOSIS — R269 Unspecified abnormalities of gait and mobility: Secondary | ICD-10-CM | POA: Diagnosis not present

## 2015-05-19 DIAGNOSIS — Z7983 Long term (current) use of bisphosphonates: Secondary | ICD-10-CM | POA: Diagnosis not present

## 2015-05-19 DIAGNOSIS — Z7982 Long term (current) use of aspirin: Secondary | ICD-10-CM | POA: Diagnosis not present

## 2015-05-19 DIAGNOSIS — Z5181 Encounter for therapeutic drug level monitoring: Secondary | ICD-10-CM | POA: Diagnosis not present

## 2015-05-23 DIAGNOSIS — J189 Pneumonia, unspecified organism: Secondary | ICD-10-CM | POA: Diagnosis not present

## 2015-05-23 DIAGNOSIS — Z7982 Long term (current) use of aspirin: Secondary | ICD-10-CM | POA: Diagnosis not present

## 2015-05-23 DIAGNOSIS — M064 Inflammatory polyarthropathy: Secondary | ICD-10-CM | POA: Diagnosis not present

## 2015-05-23 DIAGNOSIS — Z7901 Long term (current) use of anticoagulants: Secondary | ICD-10-CM | POA: Diagnosis not present

## 2015-05-23 DIAGNOSIS — D649 Anemia, unspecified: Secondary | ICD-10-CM | POA: Diagnosis not present

## 2015-05-23 DIAGNOSIS — R269 Unspecified abnormalities of gait and mobility: Secondary | ICD-10-CM | POA: Diagnosis not present

## 2015-05-23 DIAGNOSIS — Z7983 Long term (current) use of bisphosphonates: Secondary | ICD-10-CM | POA: Diagnosis not present

## 2015-05-23 DIAGNOSIS — E874 Mixed disorder of acid-base balance: Secondary | ICD-10-CM | POA: Diagnosis not present

## 2015-05-23 DIAGNOSIS — R600 Localized edema: Secondary | ICD-10-CM | POA: Diagnosis not present

## 2015-05-23 DIAGNOSIS — Z5181 Encounter for therapeutic drug level monitoring: Secondary | ICD-10-CM | POA: Diagnosis not present

## 2015-05-23 DIAGNOSIS — R7989 Other specified abnormal findings of blood chemistry: Secondary | ICD-10-CM | POA: Diagnosis not present

## 2015-05-25 DIAGNOSIS — R269 Unspecified abnormalities of gait and mobility: Secondary | ICD-10-CM | POA: Diagnosis not present

## 2015-05-25 DIAGNOSIS — Z7982 Long term (current) use of aspirin: Secondary | ICD-10-CM | POA: Diagnosis not present

## 2015-05-25 DIAGNOSIS — Z5181 Encounter for therapeutic drug level monitoring: Secondary | ICD-10-CM | POA: Diagnosis not present

## 2015-05-25 DIAGNOSIS — Z7901 Long term (current) use of anticoagulants: Secondary | ICD-10-CM | POA: Diagnosis not present

## 2015-05-25 DIAGNOSIS — Z7983 Long term (current) use of bisphosphonates: Secondary | ICD-10-CM | POA: Diagnosis not present

## 2015-06-06 DIAGNOSIS — Z7982 Long term (current) use of aspirin: Secondary | ICD-10-CM | POA: Diagnosis not present

## 2015-06-06 DIAGNOSIS — Z7901 Long term (current) use of anticoagulants: Secondary | ICD-10-CM | POA: Diagnosis not present

## 2015-06-06 DIAGNOSIS — R269 Unspecified abnormalities of gait and mobility: Secondary | ICD-10-CM | POA: Diagnosis not present

## 2015-06-06 DIAGNOSIS — Z7983 Long term (current) use of bisphosphonates: Secondary | ICD-10-CM | POA: Diagnosis not present

## 2015-06-06 DIAGNOSIS — Z5181 Encounter for therapeutic drug level monitoring: Secondary | ICD-10-CM | POA: Diagnosis not present

## 2015-06-08 DIAGNOSIS — Z7982 Long term (current) use of aspirin: Secondary | ICD-10-CM | POA: Diagnosis not present

## 2015-06-08 DIAGNOSIS — I482 Chronic atrial fibrillation: Secondary | ICD-10-CM | POA: Diagnosis not present

## 2015-06-08 DIAGNOSIS — I5030 Unspecified diastolic (congestive) heart failure: Secondary | ICD-10-CM | POA: Diagnosis not present

## 2015-06-08 DIAGNOSIS — F0150 Vascular dementia without behavioral disturbance: Secondary | ICD-10-CM | POA: Diagnosis not present

## 2015-06-08 DIAGNOSIS — Z9181 History of falling: Secondary | ICD-10-CM | POA: Diagnosis not present

## 2015-06-08 DIAGNOSIS — Z7983 Long term (current) use of bisphosphonates: Secondary | ICD-10-CM | POA: Diagnosis not present

## 2015-06-08 DIAGNOSIS — M810 Age-related osteoporosis without current pathological fracture: Secondary | ICD-10-CM | POA: Diagnosis not present

## 2015-06-08 DIAGNOSIS — R269 Unspecified abnormalities of gait and mobility: Secondary | ICD-10-CM | POA: Diagnosis not present

## 2015-06-08 DIAGNOSIS — Z7901 Long term (current) use of anticoagulants: Secondary | ICD-10-CM | POA: Diagnosis not present

## 2015-06-15 DIAGNOSIS — I5030 Unspecified diastolic (congestive) heart failure: Secondary | ICD-10-CM | POA: Diagnosis not present

## 2015-06-15 DIAGNOSIS — Z9181 History of falling: Secondary | ICD-10-CM | POA: Diagnosis not present

## 2015-06-15 DIAGNOSIS — F0150 Vascular dementia without behavioral disturbance: Secondary | ICD-10-CM | POA: Diagnosis not present

## 2015-06-15 DIAGNOSIS — M810 Age-related osteoporosis without current pathological fracture: Secondary | ICD-10-CM | POA: Diagnosis not present

## 2015-06-15 DIAGNOSIS — I482 Chronic atrial fibrillation: Secondary | ICD-10-CM | POA: Diagnosis not present

## 2015-06-15 DIAGNOSIS — R269 Unspecified abnormalities of gait and mobility: Secondary | ICD-10-CM | POA: Diagnosis not present

## 2015-06-20 DIAGNOSIS — M810 Age-related osteoporosis without current pathological fracture: Secondary | ICD-10-CM | POA: Diagnosis not present

## 2015-06-20 DIAGNOSIS — I5030 Unspecified diastolic (congestive) heart failure: Secondary | ICD-10-CM | POA: Diagnosis not present

## 2015-06-20 DIAGNOSIS — R269 Unspecified abnormalities of gait and mobility: Secondary | ICD-10-CM | POA: Diagnosis not present

## 2015-06-20 DIAGNOSIS — I4891 Unspecified atrial fibrillation: Secondary | ICD-10-CM | POA: Diagnosis not present

## 2015-06-20 DIAGNOSIS — I10 Essential (primary) hypertension: Secondary | ICD-10-CM | POA: Diagnosis not present

## 2015-06-20 DIAGNOSIS — I482 Chronic atrial fibrillation: Secondary | ICD-10-CM | POA: Diagnosis not present

## 2015-06-20 DIAGNOSIS — F0150 Vascular dementia without behavioral disturbance: Secondary | ICD-10-CM | POA: Diagnosis not present

## 2015-06-20 DIAGNOSIS — Z9181 History of falling: Secondary | ICD-10-CM | POA: Diagnosis not present

## 2015-06-29 DIAGNOSIS — R269 Unspecified abnormalities of gait and mobility: Secondary | ICD-10-CM | POA: Diagnosis not present

## 2015-06-29 DIAGNOSIS — I482 Chronic atrial fibrillation: Secondary | ICD-10-CM | POA: Diagnosis not present

## 2015-06-29 DIAGNOSIS — F0150 Vascular dementia without behavioral disturbance: Secondary | ICD-10-CM | POA: Diagnosis not present

## 2015-06-29 DIAGNOSIS — I5030 Unspecified diastolic (congestive) heart failure: Secondary | ICD-10-CM | POA: Diagnosis not present

## 2015-06-29 DIAGNOSIS — Z9181 History of falling: Secondary | ICD-10-CM | POA: Diagnosis not present

## 2015-06-29 DIAGNOSIS — M810 Age-related osteoporosis without current pathological fracture: Secondary | ICD-10-CM | POA: Diagnosis not present

## 2015-07-05 DIAGNOSIS — I482 Chronic atrial fibrillation: Secondary | ICD-10-CM | POA: Diagnosis not present

## 2015-07-05 DIAGNOSIS — I5030 Unspecified diastolic (congestive) heart failure: Secondary | ICD-10-CM | POA: Diagnosis not present

## 2015-07-05 DIAGNOSIS — F0150 Vascular dementia without behavioral disturbance: Secondary | ICD-10-CM | POA: Diagnosis not present

## 2015-07-05 DIAGNOSIS — R269 Unspecified abnormalities of gait and mobility: Secondary | ICD-10-CM | POA: Diagnosis not present

## 2015-07-05 DIAGNOSIS — M810 Age-related osteoporosis without current pathological fracture: Secondary | ICD-10-CM | POA: Diagnosis not present

## 2015-07-05 DIAGNOSIS — Z9181 History of falling: Secondary | ICD-10-CM | POA: Diagnosis not present

## 2015-07-13 DIAGNOSIS — Z9181 History of falling: Secondary | ICD-10-CM | POA: Diagnosis not present

## 2015-07-13 DIAGNOSIS — F0150 Vascular dementia without behavioral disturbance: Secondary | ICD-10-CM | POA: Diagnosis not present

## 2015-07-13 DIAGNOSIS — I482 Chronic atrial fibrillation: Secondary | ICD-10-CM | POA: Diagnosis not present

## 2015-07-13 DIAGNOSIS — I5030 Unspecified diastolic (congestive) heart failure: Secondary | ICD-10-CM | POA: Diagnosis not present

## 2015-07-13 DIAGNOSIS — M810 Age-related osteoporosis without current pathological fracture: Secondary | ICD-10-CM | POA: Diagnosis not present

## 2015-07-13 DIAGNOSIS — R269 Unspecified abnormalities of gait and mobility: Secondary | ICD-10-CM | POA: Diagnosis not present

## 2015-07-20 DIAGNOSIS — I482 Chronic atrial fibrillation: Secondary | ICD-10-CM | POA: Diagnosis not present

## 2015-07-20 DIAGNOSIS — R269 Unspecified abnormalities of gait and mobility: Secondary | ICD-10-CM | POA: Diagnosis not present

## 2015-07-20 DIAGNOSIS — I5030 Unspecified diastolic (congestive) heart failure: Secondary | ICD-10-CM | POA: Diagnosis not present

## 2015-07-20 DIAGNOSIS — F0150 Vascular dementia without behavioral disturbance: Secondary | ICD-10-CM | POA: Diagnosis not present

## 2015-07-20 DIAGNOSIS — M810 Age-related osteoporosis without current pathological fracture: Secondary | ICD-10-CM | POA: Diagnosis not present

## 2015-07-20 DIAGNOSIS — Z9181 History of falling: Secondary | ICD-10-CM | POA: Diagnosis not present

## 2015-07-29 DIAGNOSIS — F0150 Vascular dementia without behavioral disturbance: Secondary | ICD-10-CM | POA: Diagnosis not present

## 2015-07-29 DIAGNOSIS — Z9181 History of falling: Secondary | ICD-10-CM | POA: Diagnosis not present

## 2015-07-29 DIAGNOSIS — R269 Unspecified abnormalities of gait and mobility: Secondary | ICD-10-CM | POA: Diagnosis not present

## 2015-07-29 DIAGNOSIS — M810 Age-related osteoporosis without current pathological fracture: Secondary | ICD-10-CM | POA: Diagnosis not present

## 2015-07-29 DIAGNOSIS — I5030 Unspecified diastolic (congestive) heart failure: Secondary | ICD-10-CM | POA: Diagnosis not present

## 2015-07-29 DIAGNOSIS — I482 Chronic atrial fibrillation: Secondary | ICD-10-CM | POA: Diagnosis not present

## 2015-08-01 DIAGNOSIS — K5900 Constipation, unspecified: Secondary | ICD-10-CM | POA: Diagnosis not present

## 2015-08-01 DIAGNOSIS — I4891 Unspecified atrial fibrillation: Secondary | ICD-10-CM | POA: Diagnosis not present

## 2015-08-03 DIAGNOSIS — F0150 Vascular dementia without behavioral disturbance: Secondary | ICD-10-CM | POA: Diagnosis not present

## 2015-08-03 DIAGNOSIS — Z9181 History of falling: Secondary | ICD-10-CM | POA: Diagnosis not present

## 2015-08-03 DIAGNOSIS — M810 Age-related osteoporosis without current pathological fracture: Secondary | ICD-10-CM | POA: Diagnosis not present

## 2015-08-03 DIAGNOSIS — I5030 Unspecified diastolic (congestive) heart failure: Secondary | ICD-10-CM | POA: Diagnosis not present

## 2015-08-03 DIAGNOSIS — R269 Unspecified abnormalities of gait and mobility: Secondary | ICD-10-CM | POA: Diagnosis not present

## 2015-08-03 DIAGNOSIS — I482 Chronic atrial fibrillation: Secondary | ICD-10-CM | POA: Diagnosis not present

## 2015-08-07 DIAGNOSIS — Z7901 Long term (current) use of anticoagulants: Secondary | ICD-10-CM | POA: Diagnosis not present

## 2015-08-07 DIAGNOSIS — Z7982 Long term (current) use of aspirin: Secondary | ICD-10-CM | POA: Diagnosis not present

## 2015-08-07 DIAGNOSIS — Z7983 Long term (current) use of bisphosphonates: Secondary | ICD-10-CM | POA: Diagnosis not present

## 2015-08-07 DIAGNOSIS — R2681 Unsteadiness on feet: Secondary | ICD-10-CM | POA: Diagnosis not present

## 2015-08-07 DIAGNOSIS — I5030 Unspecified diastolic (congestive) heart failure: Secondary | ICD-10-CM | POA: Diagnosis not present

## 2015-08-07 DIAGNOSIS — I482 Chronic atrial fibrillation: Secondary | ICD-10-CM | POA: Diagnosis not present

## 2015-08-07 DIAGNOSIS — F0150 Vascular dementia without behavioral disturbance: Secondary | ICD-10-CM | POA: Diagnosis not present

## 2015-08-07 DIAGNOSIS — M810 Age-related osteoporosis without current pathological fracture: Secondary | ICD-10-CM | POA: Diagnosis not present

## 2015-08-07 DIAGNOSIS — Z9181 History of falling: Secondary | ICD-10-CM | POA: Diagnosis not present

## 2015-08-11 DIAGNOSIS — I5030 Unspecified diastolic (congestive) heart failure: Secondary | ICD-10-CM | POA: Diagnosis not present

## 2015-08-11 DIAGNOSIS — R2681 Unsteadiness on feet: Secondary | ICD-10-CM | POA: Diagnosis not present

## 2015-08-11 DIAGNOSIS — M810 Age-related osteoporosis without current pathological fracture: Secondary | ICD-10-CM | POA: Diagnosis not present

## 2015-08-11 DIAGNOSIS — Z9181 History of falling: Secondary | ICD-10-CM | POA: Diagnosis not present

## 2015-08-11 DIAGNOSIS — I482 Chronic atrial fibrillation: Secondary | ICD-10-CM | POA: Diagnosis not present

## 2015-08-11 DIAGNOSIS — F0150 Vascular dementia without behavioral disturbance: Secondary | ICD-10-CM | POA: Diagnosis not present

## 2015-08-15 DIAGNOSIS — I4891 Unspecified atrial fibrillation: Secondary | ICD-10-CM | POA: Diagnosis not present

## 2015-08-15 DIAGNOSIS — I5030 Unspecified diastolic (congestive) heart failure: Secondary | ICD-10-CM | POA: Diagnosis not present

## 2015-08-15 DIAGNOSIS — F0150 Vascular dementia without behavioral disturbance: Secondary | ICD-10-CM | POA: Diagnosis not present

## 2015-08-15 DIAGNOSIS — I10 Essential (primary) hypertension: Secondary | ICD-10-CM | POA: Diagnosis not present

## 2015-08-19 DIAGNOSIS — F0150 Vascular dementia without behavioral disturbance: Secondary | ICD-10-CM | POA: Diagnosis not present

## 2015-08-19 DIAGNOSIS — M810 Age-related osteoporosis without current pathological fracture: Secondary | ICD-10-CM | POA: Diagnosis not present

## 2015-08-19 DIAGNOSIS — Z9181 History of falling: Secondary | ICD-10-CM | POA: Diagnosis not present

## 2015-08-19 DIAGNOSIS — R2681 Unsteadiness on feet: Secondary | ICD-10-CM | POA: Diagnosis not present

## 2015-08-19 DIAGNOSIS — I5030 Unspecified diastolic (congestive) heart failure: Secondary | ICD-10-CM | POA: Diagnosis not present

## 2015-08-19 DIAGNOSIS — I482 Chronic atrial fibrillation: Secondary | ICD-10-CM | POA: Diagnosis not present

## 2015-08-24 DIAGNOSIS — Z9181 History of falling: Secondary | ICD-10-CM | POA: Diagnosis not present

## 2015-08-24 DIAGNOSIS — I5030 Unspecified diastolic (congestive) heart failure: Secondary | ICD-10-CM | POA: Diagnosis not present

## 2015-08-24 DIAGNOSIS — M810 Age-related osteoporosis without current pathological fracture: Secondary | ICD-10-CM | POA: Diagnosis not present

## 2015-08-24 DIAGNOSIS — F0150 Vascular dementia without behavioral disturbance: Secondary | ICD-10-CM | POA: Diagnosis not present

## 2015-08-24 DIAGNOSIS — R2681 Unsteadiness on feet: Secondary | ICD-10-CM | POA: Diagnosis not present

## 2015-08-24 DIAGNOSIS — I482 Chronic atrial fibrillation: Secondary | ICD-10-CM | POA: Diagnosis not present

## 2015-09-01 DIAGNOSIS — I5030 Unspecified diastolic (congestive) heart failure: Secondary | ICD-10-CM | POA: Diagnosis not present

## 2015-09-01 DIAGNOSIS — F0150 Vascular dementia without behavioral disturbance: Secondary | ICD-10-CM | POA: Diagnosis not present

## 2015-09-01 DIAGNOSIS — M810 Age-related osteoporosis without current pathological fracture: Secondary | ICD-10-CM | POA: Diagnosis not present

## 2015-09-01 DIAGNOSIS — I482 Chronic atrial fibrillation: Secondary | ICD-10-CM | POA: Diagnosis not present

## 2015-09-01 DIAGNOSIS — R2681 Unsteadiness on feet: Secondary | ICD-10-CM | POA: Diagnosis not present

## 2015-09-01 DIAGNOSIS — Z9181 History of falling: Secondary | ICD-10-CM | POA: Diagnosis not present

## 2015-09-09 DIAGNOSIS — R2681 Unsteadiness on feet: Secondary | ICD-10-CM | POA: Diagnosis not present

## 2015-09-09 DIAGNOSIS — Z9181 History of falling: Secondary | ICD-10-CM | POA: Diagnosis not present

## 2015-09-09 DIAGNOSIS — I5030 Unspecified diastolic (congestive) heart failure: Secondary | ICD-10-CM | POA: Diagnosis not present

## 2015-09-09 DIAGNOSIS — I482 Chronic atrial fibrillation: Secondary | ICD-10-CM | POA: Diagnosis not present

## 2015-09-09 DIAGNOSIS — F0150 Vascular dementia without behavioral disturbance: Secondary | ICD-10-CM | POA: Diagnosis not present

## 2015-09-09 DIAGNOSIS — M810 Age-related osteoporosis without current pathological fracture: Secondary | ICD-10-CM | POA: Diagnosis not present

## 2015-09-13 DIAGNOSIS — F0150 Vascular dementia without behavioral disturbance: Secondary | ICD-10-CM | POA: Diagnosis not present

## 2015-09-13 DIAGNOSIS — Z9181 History of falling: Secondary | ICD-10-CM | POA: Diagnosis not present

## 2015-09-13 DIAGNOSIS — I482 Chronic atrial fibrillation: Secondary | ICD-10-CM | POA: Diagnosis not present

## 2015-09-13 DIAGNOSIS — M810 Age-related osteoporosis without current pathological fracture: Secondary | ICD-10-CM | POA: Diagnosis not present

## 2015-09-13 DIAGNOSIS — R2681 Unsteadiness on feet: Secondary | ICD-10-CM | POA: Diagnosis not present

## 2015-09-13 DIAGNOSIS — I5030 Unspecified diastolic (congestive) heart failure: Secondary | ICD-10-CM | POA: Diagnosis not present

## 2015-09-21 DIAGNOSIS — F0150 Vascular dementia without behavioral disturbance: Secondary | ICD-10-CM | POA: Diagnosis not present

## 2015-09-21 DIAGNOSIS — I5030 Unspecified diastolic (congestive) heart failure: Secondary | ICD-10-CM | POA: Diagnosis not present

## 2015-09-21 DIAGNOSIS — R2681 Unsteadiness on feet: Secondary | ICD-10-CM | POA: Diagnosis not present

## 2015-09-21 DIAGNOSIS — I482 Chronic atrial fibrillation: Secondary | ICD-10-CM | POA: Diagnosis not present

## 2015-09-21 DIAGNOSIS — M810 Age-related osteoporosis without current pathological fracture: Secondary | ICD-10-CM | POA: Diagnosis not present

## 2015-09-21 DIAGNOSIS — Z9181 History of falling: Secondary | ICD-10-CM | POA: Diagnosis not present

## 2015-09-26 DIAGNOSIS — I5030 Unspecified diastolic (congestive) heart failure: Secondary | ICD-10-CM | POA: Diagnosis not present

## 2015-09-26 DIAGNOSIS — I482 Chronic atrial fibrillation: Secondary | ICD-10-CM | POA: Diagnosis not present

## 2015-09-26 DIAGNOSIS — R2681 Unsteadiness on feet: Secondary | ICD-10-CM | POA: Diagnosis not present

## 2015-09-26 DIAGNOSIS — Z9181 History of falling: Secondary | ICD-10-CM | POA: Diagnosis not present

## 2015-09-26 DIAGNOSIS — F0150 Vascular dementia without behavioral disturbance: Secondary | ICD-10-CM | POA: Diagnosis not present

## 2015-09-26 DIAGNOSIS — M810 Age-related osteoporosis without current pathological fracture: Secondary | ICD-10-CM | POA: Diagnosis not present

## 2015-10-03 DIAGNOSIS — I10 Essential (primary) hypertension: Secondary | ICD-10-CM | POA: Diagnosis not present

## 2015-10-03 DIAGNOSIS — I5030 Unspecified diastolic (congestive) heart failure: Secondary | ICD-10-CM | POA: Diagnosis not present

## 2015-10-03 DIAGNOSIS — I4891 Unspecified atrial fibrillation: Secondary | ICD-10-CM | POA: Diagnosis not present

## 2015-10-04 DIAGNOSIS — I482 Chronic atrial fibrillation: Secondary | ICD-10-CM | POA: Diagnosis not present

## 2015-10-04 DIAGNOSIS — M810 Age-related osteoporosis without current pathological fracture: Secondary | ICD-10-CM | POA: Diagnosis not present

## 2015-10-04 DIAGNOSIS — R2681 Unsteadiness on feet: Secondary | ICD-10-CM | POA: Diagnosis not present

## 2015-10-04 DIAGNOSIS — I5030 Unspecified diastolic (congestive) heart failure: Secondary | ICD-10-CM | POA: Diagnosis not present

## 2015-10-04 DIAGNOSIS — F0150 Vascular dementia without behavioral disturbance: Secondary | ICD-10-CM | POA: Diagnosis not present

## 2015-10-04 DIAGNOSIS — Z9181 History of falling: Secondary | ICD-10-CM | POA: Diagnosis not present

## 2015-10-06 DIAGNOSIS — I482 Chronic atrial fibrillation: Secondary | ICD-10-CM | POA: Diagnosis not present

## 2015-10-06 DIAGNOSIS — Z7901 Long term (current) use of anticoagulants: Secondary | ICD-10-CM | POA: Diagnosis not present

## 2015-10-06 DIAGNOSIS — R2681 Unsteadiness on feet: Secondary | ICD-10-CM | POA: Diagnosis not present

## 2015-10-06 DIAGNOSIS — M810 Age-related osteoporosis without current pathological fracture: Secondary | ICD-10-CM | POA: Diagnosis not present

## 2015-10-06 DIAGNOSIS — Z7983 Long term (current) use of bisphosphonates: Secondary | ICD-10-CM | POA: Diagnosis not present

## 2015-10-06 DIAGNOSIS — I5030 Unspecified diastolic (congestive) heart failure: Secondary | ICD-10-CM | POA: Diagnosis not present

## 2015-10-06 DIAGNOSIS — Z9181 History of falling: Secondary | ICD-10-CM | POA: Diagnosis not present

## 2015-10-06 DIAGNOSIS — Z7982 Long term (current) use of aspirin: Secondary | ICD-10-CM | POA: Diagnosis not present

## 2015-10-06 DIAGNOSIS — F0150 Vascular dementia without behavioral disturbance: Secondary | ICD-10-CM | POA: Diagnosis not present

## 2015-10-10 DIAGNOSIS — M810 Age-related osteoporosis without current pathological fracture: Secondary | ICD-10-CM | POA: Diagnosis not present

## 2015-10-10 DIAGNOSIS — Z9181 History of falling: Secondary | ICD-10-CM | POA: Diagnosis not present

## 2015-10-10 DIAGNOSIS — R2681 Unsteadiness on feet: Secondary | ICD-10-CM | POA: Diagnosis not present

## 2015-10-10 DIAGNOSIS — I482 Chronic atrial fibrillation: Secondary | ICD-10-CM | POA: Diagnosis not present

## 2015-10-10 DIAGNOSIS — F0150 Vascular dementia without behavioral disturbance: Secondary | ICD-10-CM | POA: Diagnosis not present

## 2015-10-10 DIAGNOSIS — I5030 Unspecified diastolic (congestive) heart failure: Secondary | ICD-10-CM | POA: Diagnosis not present

## 2015-10-19 DIAGNOSIS — M810 Age-related osteoporosis without current pathological fracture: Secondary | ICD-10-CM | POA: Diagnosis not present

## 2015-10-19 DIAGNOSIS — Z9181 History of falling: Secondary | ICD-10-CM | POA: Diagnosis not present

## 2015-10-19 DIAGNOSIS — I482 Chronic atrial fibrillation: Secondary | ICD-10-CM | POA: Diagnosis not present

## 2015-10-19 DIAGNOSIS — F0150 Vascular dementia without behavioral disturbance: Secondary | ICD-10-CM | POA: Diagnosis not present

## 2015-10-19 DIAGNOSIS — I5030 Unspecified diastolic (congestive) heart failure: Secondary | ICD-10-CM | POA: Diagnosis not present

## 2015-10-19 DIAGNOSIS — R2681 Unsteadiness on feet: Secondary | ICD-10-CM | POA: Diagnosis not present

## 2015-11-03 DIAGNOSIS — I5030 Unspecified diastolic (congestive) heart failure: Secondary | ICD-10-CM | POA: Diagnosis not present

## 2015-11-03 DIAGNOSIS — Z9181 History of falling: Secondary | ICD-10-CM | POA: Diagnosis not present

## 2015-11-03 DIAGNOSIS — R2681 Unsteadiness on feet: Secondary | ICD-10-CM | POA: Diagnosis not present

## 2015-11-03 DIAGNOSIS — I482 Chronic atrial fibrillation: Secondary | ICD-10-CM | POA: Diagnosis not present

## 2015-11-03 DIAGNOSIS — F0150 Vascular dementia without behavioral disturbance: Secondary | ICD-10-CM | POA: Diagnosis not present

## 2015-11-03 DIAGNOSIS — M810 Age-related osteoporosis without current pathological fracture: Secondary | ICD-10-CM | POA: Diagnosis not present

## 2015-11-14 DIAGNOSIS — I5020 Unspecified systolic (congestive) heart failure: Secondary | ICD-10-CM | POA: Diagnosis not present

## 2015-11-14 DIAGNOSIS — I4891 Unspecified atrial fibrillation: Secondary | ICD-10-CM | POA: Diagnosis not present

## 2015-11-14 DIAGNOSIS — I10 Essential (primary) hypertension: Secondary | ICD-10-CM | POA: Diagnosis not present

## 2015-11-16 DIAGNOSIS — I5030 Unspecified diastolic (congestive) heart failure: Secondary | ICD-10-CM | POA: Diagnosis not present

## 2015-11-16 DIAGNOSIS — Z9181 History of falling: Secondary | ICD-10-CM | POA: Diagnosis not present

## 2015-11-16 DIAGNOSIS — F0150 Vascular dementia without behavioral disturbance: Secondary | ICD-10-CM | POA: Diagnosis not present

## 2015-11-16 DIAGNOSIS — R2681 Unsteadiness on feet: Secondary | ICD-10-CM | POA: Diagnosis not present

## 2015-11-16 DIAGNOSIS — M810 Age-related osteoporosis without current pathological fracture: Secondary | ICD-10-CM | POA: Diagnosis not present

## 2015-11-16 DIAGNOSIS — I482 Chronic atrial fibrillation: Secondary | ICD-10-CM | POA: Diagnosis not present

## 2015-11-30 DIAGNOSIS — R2681 Unsteadiness on feet: Secondary | ICD-10-CM | POA: Diagnosis not present

## 2015-11-30 DIAGNOSIS — Z9181 History of falling: Secondary | ICD-10-CM | POA: Diagnosis not present

## 2015-11-30 DIAGNOSIS — M810 Age-related osteoporosis without current pathological fracture: Secondary | ICD-10-CM | POA: Diagnosis not present

## 2015-11-30 DIAGNOSIS — I5030 Unspecified diastolic (congestive) heart failure: Secondary | ICD-10-CM | POA: Diagnosis not present

## 2015-11-30 DIAGNOSIS — I482 Chronic atrial fibrillation: Secondary | ICD-10-CM | POA: Diagnosis not present

## 2015-11-30 DIAGNOSIS — F0150 Vascular dementia without behavioral disturbance: Secondary | ICD-10-CM | POA: Diagnosis not present

## 2015-12-19 DIAGNOSIS — I509 Heart failure, unspecified: Secondary | ICD-10-CM | POA: Diagnosis not present

## 2015-12-19 DIAGNOSIS — I10 Essential (primary) hypertension: Secondary | ICD-10-CM | POA: Diagnosis not present

## 2015-12-19 DIAGNOSIS — Z7901 Long term (current) use of anticoagulants: Secondary | ICD-10-CM | POA: Diagnosis not present

## 2016-03-03 DIAGNOSIS — R0602 Shortness of breath: Secondary | ICD-10-CM | POA: Diagnosis not present

## 2016-03-03 DIAGNOSIS — R531 Weakness: Secondary | ICD-10-CM | POA: Diagnosis not present

## 2016-03-04 DIAGNOSIS — N390 Urinary tract infection, site not specified: Secondary | ICD-10-CM | POA: Diagnosis not present

## 2016-03-05 DIAGNOSIS — R7989 Other specified abnormal findings of blood chemistry: Secondary | ICD-10-CM | POA: Diagnosis not present

## 2016-03-05 DIAGNOSIS — I5023 Acute on chronic systolic (congestive) heart failure: Secondary | ICD-10-CM | POA: Diagnosis not present

## 2016-03-05 DIAGNOSIS — N390 Urinary tract infection, site not specified: Secondary | ICD-10-CM | POA: Diagnosis not present

## 2016-03-05 DIAGNOSIS — R9431 Abnormal electrocardiogram [ECG] [EKG]: Secondary | ICD-10-CM | POA: Diagnosis not present

## 2016-03-07 DIAGNOSIS — D649 Anemia, unspecified: Secondary | ICD-10-CM | POA: Diagnosis not present

## 2016-03-08 DIAGNOSIS — D649 Anemia, unspecified: Secondary | ICD-10-CM | POA: Diagnosis not present

## 2016-03-09 DIAGNOSIS — N189 Chronic kidney disease, unspecified: Secondary | ICD-10-CM | POA: Diagnosis not present

## 2016-03-09 DIAGNOSIS — Z8673 Personal history of transient ischemic attack (TIA), and cerebral infarction without residual deficits: Secondary | ICD-10-CM | POA: Diagnosis not present

## 2016-03-09 DIAGNOSIS — Z79899 Other long term (current) drug therapy: Secondary | ICD-10-CM | POA: Diagnosis not present

## 2016-03-09 DIAGNOSIS — Z7901 Long term (current) use of anticoagulants: Secondary | ICD-10-CM | POA: Diagnosis not present

## 2016-03-09 DIAGNOSIS — R0682 Tachypnea, not elsewhere classified: Secondary | ICD-10-CM | POA: Diagnosis not present

## 2016-03-09 DIAGNOSIS — I130 Hypertensive heart and chronic kidney disease with heart failure and stage 1 through stage 4 chronic kidney disease, or unspecified chronic kidney disease: Secondary | ICD-10-CM | POA: Diagnosis not present

## 2016-03-09 DIAGNOSIS — Z7982 Long term (current) use of aspirin: Secondary | ICD-10-CM | POA: Diagnosis not present

## 2016-03-09 DIAGNOSIS — D649 Anemia, unspecified: Secondary | ICD-10-CM | POA: Diagnosis not present

## 2016-03-09 DIAGNOSIS — K922 Gastrointestinal hemorrhage, unspecified: Secondary | ICD-10-CM | POA: Diagnosis not present

## 2016-03-09 DIAGNOSIS — I509 Heart failure, unspecified: Secondary | ICD-10-CM | POA: Diagnosis not present

## 2016-03-09 DIAGNOSIS — I4891 Unspecified atrial fibrillation: Secondary | ICD-10-CM | POA: Diagnosis not present

## 2016-03-09 DIAGNOSIS — I482 Chronic atrial fibrillation: Secondary | ICD-10-CM | POA: Diagnosis not present

## 2016-03-09 DIAGNOSIS — N184 Chronic kidney disease, stage 4 (severe): Secondary | ICD-10-CM | POA: Diagnosis not present

## 2016-03-10 DIAGNOSIS — I4891 Unspecified atrial fibrillation: Secondary | ICD-10-CM | POA: Diagnosis not present

## 2016-03-10 DIAGNOSIS — D649 Anemia, unspecified: Secondary | ICD-10-CM | POA: Diagnosis not present

## 2016-03-10 DIAGNOSIS — N184 Chronic kidney disease, stage 4 (severe): Secondary | ICD-10-CM | POA: Diagnosis not present

## 2016-03-17 DIAGNOSIS — I959 Hypotension, unspecified: Secondary | ICD-10-CM | POA: Diagnosis not present

## 2016-03-17 DIAGNOSIS — R031 Nonspecific low blood-pressure reading: Secondary | ICD-10-CM | POA: Diagnosis not present

## 2016-03-17 DIAGNOSIS — R531 Weakness: Secondary | ICD-10-CM | POA: Diagnosis not present

## 2016-03-19 DIAGNOSIS — I951 Orthostatic hypotension: Secondary | ICD-10-CM | POA: Diagnosis not present

## 2016-03-23 DIAGNOSIS — D649 Anemia, unspecified: Secondary | ICD-10-CM | POA: Diagnosis not present

## 2016-03-24 DIAGNOSIS — E860 Dehydration: Secondary | ICD-10-CM | POA: Diagnosis not present

## 2016-03-24 DIAGNOSIS — D649 Anemia, unspecified: Secondary | ICD-10-CM | POA: Diagnosis not present

## 2016-03-24 DIAGNOSIS — I959 Hypotension, unspecified: Secondary | ICD-10-CM | POA: Diagnosis not present

## 2016-03-24 DIAGNOSIS — N289 Disorder of kidney and ureter, unspecified: Secondary | ICD-10-CM | POA: Diagnosis not present

## 2016-04-23 DIAGNOSIS — Z23 Encounter for immunization: Secondary | ICD-10-CM | POA: Diagnosis not present

## 2016-05-18 DIAGNOSIS — M79609 Pain in unspecified limb: Secondary | ICD-10-CM | POA: Diagnosis not present

## 2016-05-18 DIAGNOSIS — L03115 Cellulitis of right lower limb: Secondary | ICD-10-CM | POA: Diagnosis not present

## 2016-05-18 DIAGNOSIS — L03116 Cellulitis of left lower limb: Secondary | ICD-10-CM | POA: Diagnosis not present

## 2016-05-18 DIAGNOSIS — I5030 Unspecified diastolic (congestive) heart failure: Secondary | ICD-10-CM | POA: Diagnosis not present

## 2016-05-21 DIAGNOSIS — R609 Edema, unspecified: Secondary | ICD-10-CM | POA: Diagnosis not present

## 2016-05-21 DIAGNOSIS — M79661 Pain in right lower leg: Secondary | ICD-10-CM | POA: Diagnosis not present

## 2016-07-17 DIAGNOSIS — D649 Anemia, unspecified: Secondary | ICD-10-CM | POA: Diagnosis present

## 2016-07-17 DIAGNOSIS — I129 Hypertensive chronic kidney disease with stage 1 through stage 4 chronic kidney disease, or unspecified chronic kidney disease: Secondary | ICD-10-CM | POA: Diagnosis not present

## 2016-07-17 DIAGNOSIS — G92 Toxic encephalopathy: Secondary | ICD-10-CM | POA: Diagnosis present

## 2016-07-17 DIAGNOSIS — R2681 Unsteadiness on feet: Secondary | ICD-10-CM | POA: Diagnosis not present

## 2016-07-17 DIAGNOSIS — Z8673 Personal history of transient ischemic attack (TIA), and cerebral infarction without residual deficits: Secondary | ICD-10-CM | POA: Diagnosis not present

## 2016-07-17 DIAGNOSIS — I4891 Unspecified atrial fibrillation: Secondary | ICD-10-CM | POA: Diagnosis not present

## 2016-07-17 DIAGNOSIS — J9601 Acute respiratory failure with hypoxia: Secondary | ICD-10-CM | POA: Diagnosis not present

## 2016-07-17 DIAGNOSIS — R0902 Hypoxemia: Secondary | ICD-10-CM | POA: Diagnosis not present

## 2016-07-17 DIAGNOSIS — Z9181 History of falling: Secondary | ICD-10-CM | POA: Diagnosis not present

## 2016-07-17 DIAGNOSIS — R296 Repeated falls: Secondary | ICD-10-CM

## 2016-07-17 DIAGNOSIS — B961 Klebsiella pneumoniae [K. pneumoniae] as the cause of diseases classified elsewhere: Secondary | ICD-10-CM | POA: Diagnosis not present

## 2016-07-17 DIAGNOSIS — Z79899 Other long term (current) drug therapy: Secondary | ICD-10-CM | POA: Diagnosis not present

## 2016-07-17 DIAGNOSIS — E039 Hypothyroidism, unspecified: Secondary | ICD-10-CM | POA: Diagnosis not present

## 2016-07-17 DIAGNOSIS — I480 Paroxysmal atrial fibrillation: Secondary | ICD-10-CM | POA: Diagnosis not present

## 2016-07-17 DIAGNOSIS — A419 Sepsis, unspecified organism: Secondary | ICD-10-CM | POA: Diagnosis not present

## 2016-07-17 DIAGNOSIS — N189 Chronic kidney disease, unspecified: Secondary | ICD-10-CM | POA: Diagnosis not present

## 2016-07-17 DIAGNOSIS — R079 Chest pain, unspecified: Secondary | ICD-10-CM | POA: Diagnosis not present

## 2016-07-17 DIAGNOSIS — Z7982 Long term (current) use of aspirin: Secondary | ICD-10-CM | POA: Diagnosis not present

## 2016-07-17 DIAGNOSIS — Z23 Encounter for immunization: Secondary | ICD-10-CM | POA: Diagnosis not present

## 2016-07-17 DIAGNOSIS — R069 Unspecified abnormalities of breathing: Secondary | ICD-10-CM | POA: Diagnosis not present

## 2016-07-17 DIAGNOSIS — I509 Heart failure, unspecified: Secondary | ICD-10-CM | POA: Diagnosis present

## 2016-07-17 DIAGNOSIS — R2243 Localized swelling, mass and lump, lower limb, bilateral: Secondary | ICD-10-CM

## 2016-07-17 DIAGNOSIS — F0150 Vascular dementia without behavioral disturbance: Secondary | ICD-10-CM | POA: Diagnosis present

## 2016-07-17 DIAGNOSIS — J189 Pneumonia, unspecified organism: Secondary | ICD-10-CM | POA: Diagnosis not present

## 2016-07-17 DIAGNOSIS — N390 Urinary tract infection, site not specified: Secondary | ICD-10-CM | POA: Diagnosis not present

## 2016-07-19 DIAGNOSIS — N390 Urinary tract infection, site not specified: Secondary | ICD-10-CM

## 2016-09-11 DIAGNOSIS — I129 Hypertensive chronic kidney disease with stage 1 through stage 4 chronic kidney disease, or unspecified chronic kidney disease: Secondary | ICD-10-CM | POA: Diagnosis not present

## 2016-09-11 DIAGNOSIS — I4891 Unspecified atrial fibrillation: Secondary | ICD-10-CM | POA: Diagnosis not present

## 2016-09-11 DIAGNOSIS — F0390 Unspecified dementia without behavioral disturbance: Secondary | ICD-10-CM | POA: Diagnosis not present

## 2016-09-11 DIAGNOSIS — I509 Heart failure, unspecified: Secondary | ICD-10-CM | POA: Diagnosis not present

## 2016-09-11 DIAGNOSIS — N183 Chronic kidney disease, stage 3 (moderate): Secondary | ICD-10-CM | POA: Diagnosis not present

## 2016-09-11 DIAGNOSIS — Z681 Body mass index (BMI) 19 or less, adult: Secondary | ICD-10-CM | POA: Diagnosis not present

## 2016-09-12 DIAGNOSIS — D649 Anemia, unspecified: Secondary | ICD-10-CM | POA: Diagnosis not present

## 2016-09-12 DIAGNOSIS — E119 Type 2 diabetes mellitus without complications: Secondary | ICD-10-CM | POA: Diagnosis not present

## 2016-09-12 DIAGNOSIS — E559 Vitamin D deficiency, unspecified: Secondary | ICD-10-CM | POA: Diagnosis not present

## 2016-09-12 DIAGNOSIS — E039 Hypothyroidism, unspecified: Secondary | ICD-10-CM | POA: Diagnosis not present

## 2016-09-12 DIAGNOSIS — I10 Essential (primary) hypertension: Secondary | ICD-10-CM | POA: Diagnosis not present

## 2016-09-17 DIAGNOSIS — R262 Difficulty in walking, not elsewhere classified: Secondary | ICD-10-CM | POA: Diagnosis not present

## 2016-09-17 DIAGNOSIS — I739 Peripheral vascular disease, unspecified: Secondary | ICD-10-CM | POA: Diagnosis not present

## 2016-09-17 DIAGNOSIS — B351 Tinea unguium: Secondary | ICD-10-CM | POA: Diagnosis not present

## 2016-09-17 DIAGNOSIS — I70203 Unspecified atherosclerosis of native arteries of extremities, bilateral legs: Secondary | ICD-10-CM | POA: Diagnosis not present

## 2016-11-27 DIAGNOSIS — I959 Hypotension, unspecified: Secondary | ICD-10-CM | POA: Diagnosis not present

## 2016-11-27 DIAGNOSIS — N183 Chronic kidney disease, stage 3 (moderate): Secondary | ICD-10-CM | POA: Diagnosis not present

## 2016-11-27 DIAGNOSIS — I129 Hypertensive chronic kidney disease with stage 1 through stage 4 chronic kidney disease, or unspecified chronic kidney disease: Secondary | ICD-10-CM | POA: Diagnosis not present

## 2016-11-27 DIAGNOSIS — R54 Age-related physical debility: Secondary | ICD-10-CM | POA: Diagnosis not present

## 2016-12-21 IMAGING — CT CT HEAD W/O CM
1 of 2 series · 14 of 30 positions shown, 18 images · non-contrast
Comparison: None

CLINICAL DATA: Code stroke, onset of LEFT arm numbness and pain at
6133 hours

EXAM:
CT HEAD WITHOUT CONTRAST
TECHNIQUE: Contiguous axial images were obtained from the base of the skull
through the vertex without intravenous contrast.

[Series 3: head 5.0 h30s · axial · 0.41mm/px · z∈[-99,+16]mm · 14 of 27 slices shown, 18 images]
[im 2/27  brain]
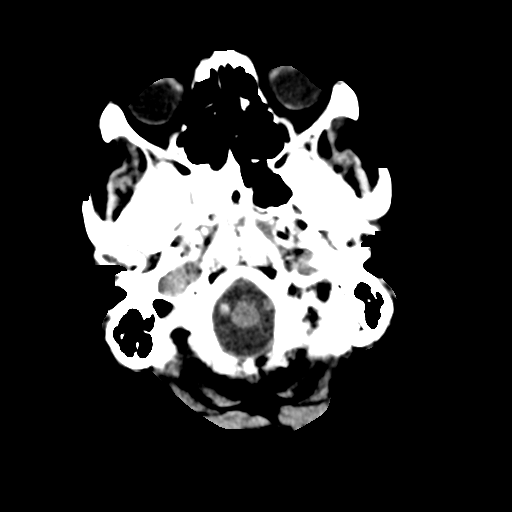
[im 2/27  bone]
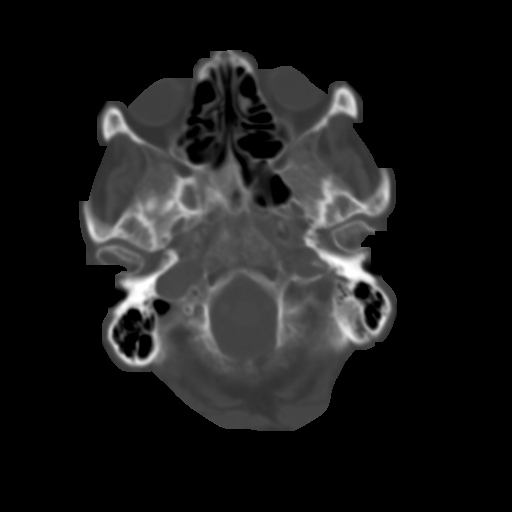
[im 4/27  brain]
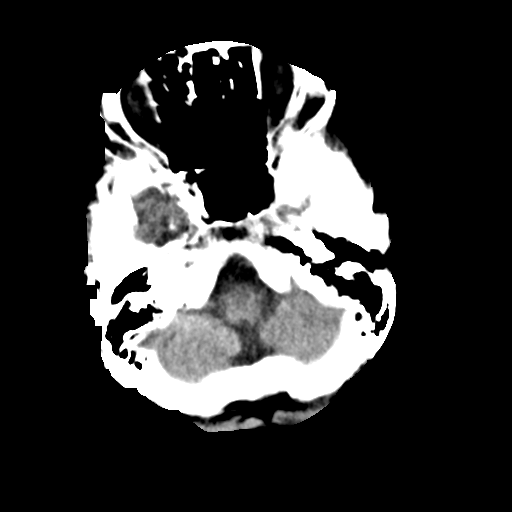
[im 6/27  brain]
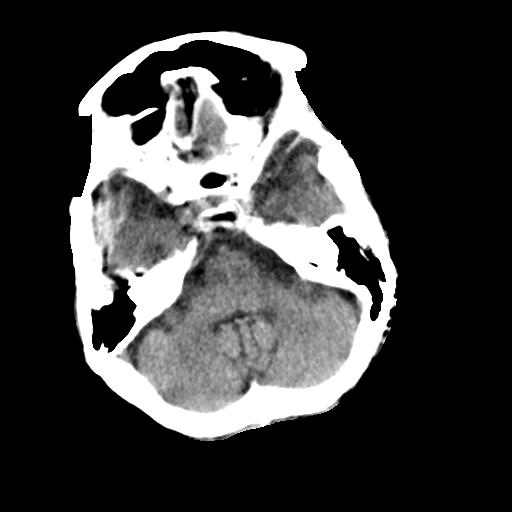
[im 7/27  brain]
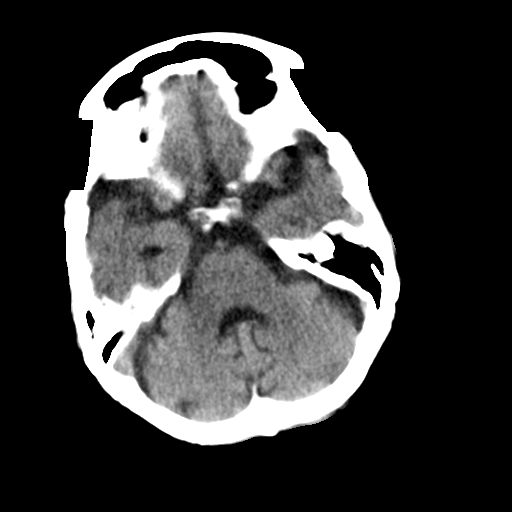
[im 9/27  brain]
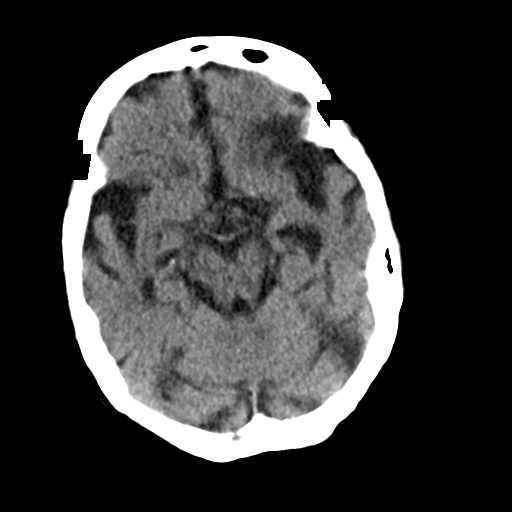
[im 9/27  bone]
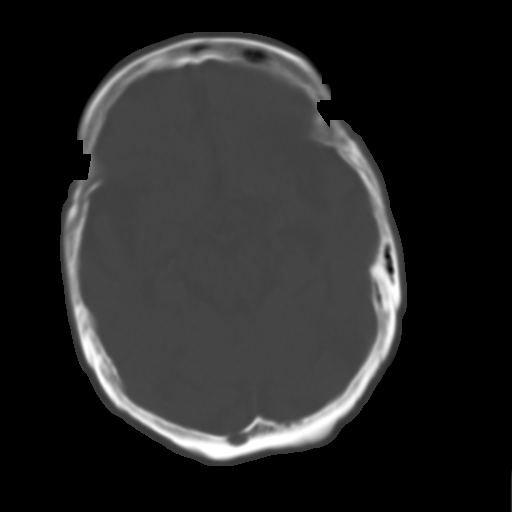
[im 11/27  brain]
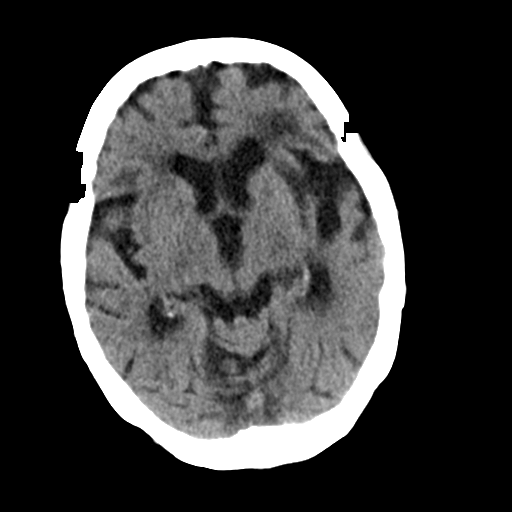
[im 13/27  brain]
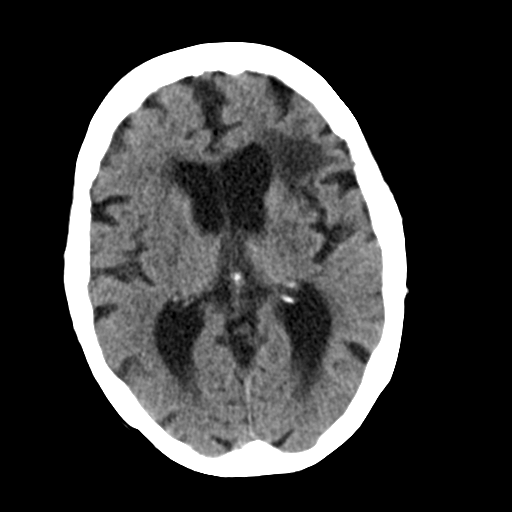
[im 14/27  brain]
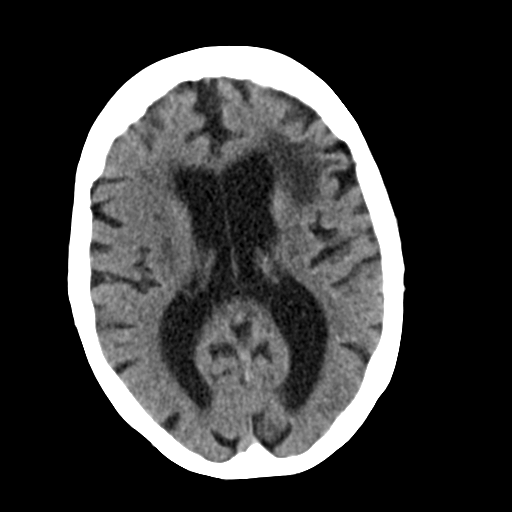
[im 16/27  brain]
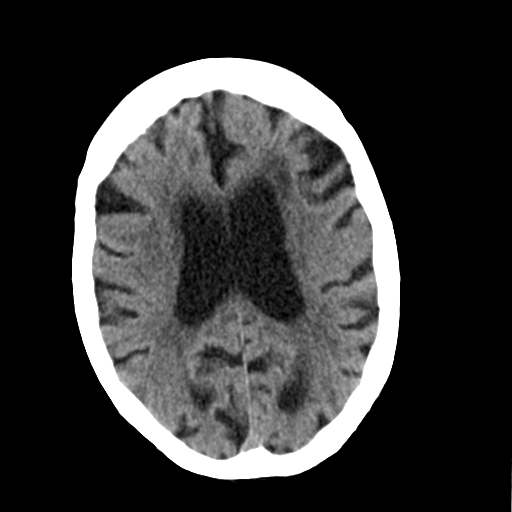
[im 16/27  bone]
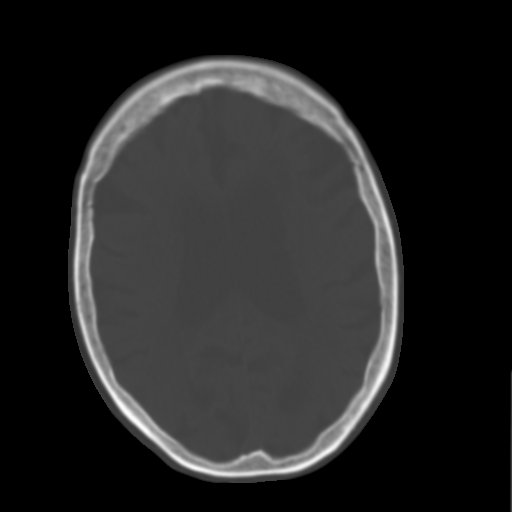
[im 18/27  brain]
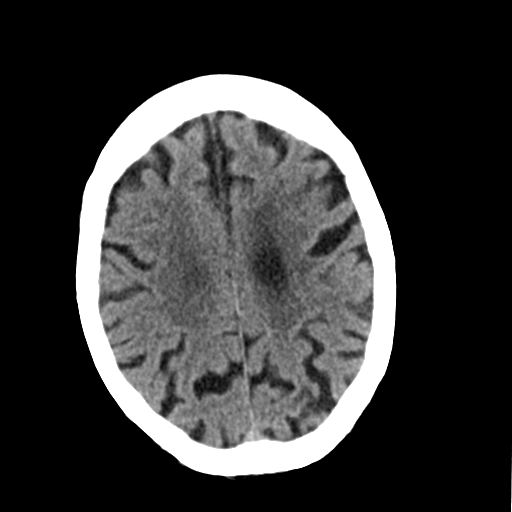
[im 20/27  brain]
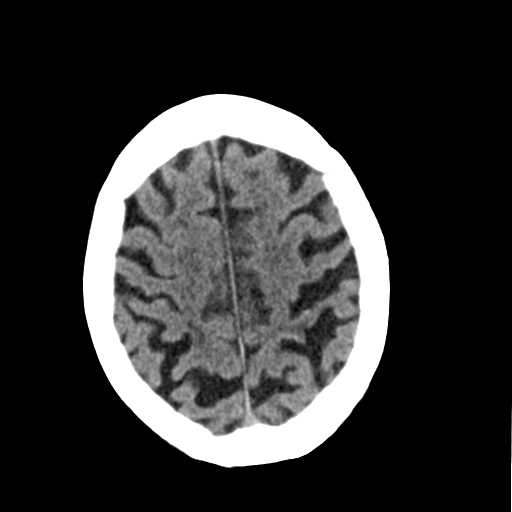
[im 21/27  brain]
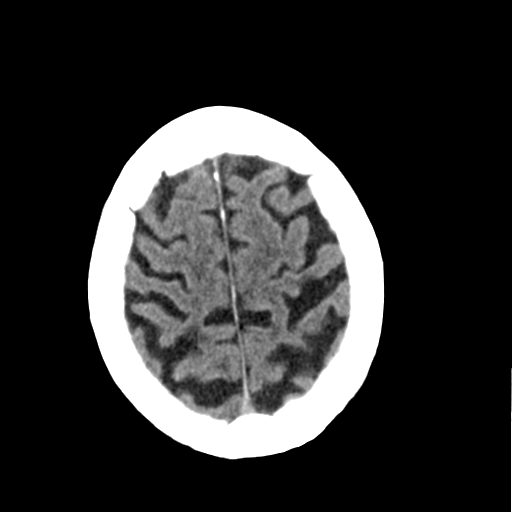
[im 23/27  brain]
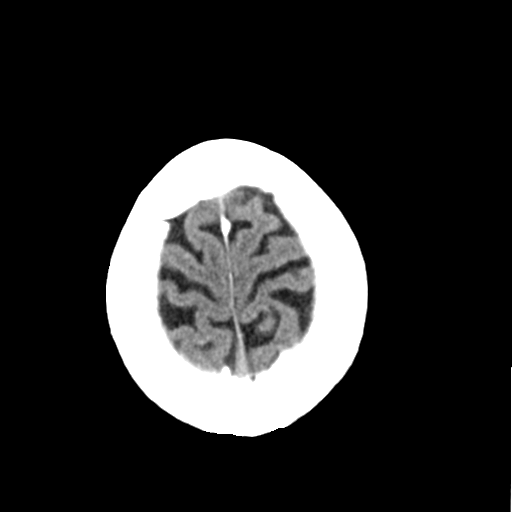
[im 23/27  bone]
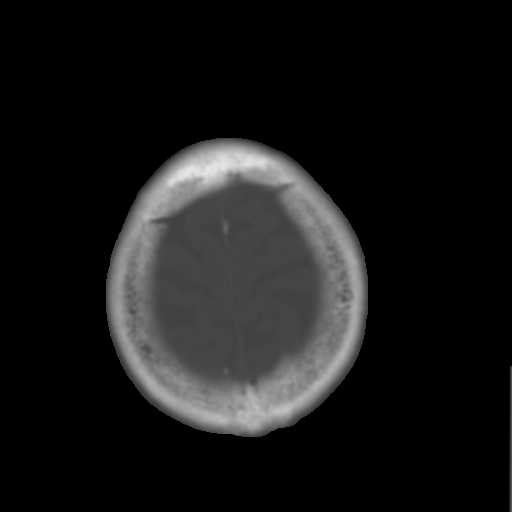
[im 25/27  brain]
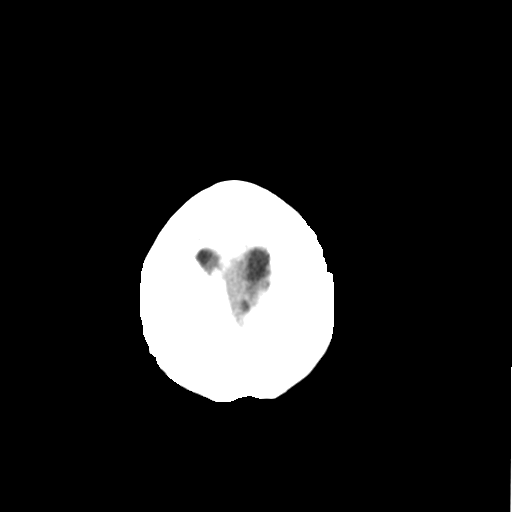

[14 of 30 positions shown; findings below may reference images not displayed]

FINDINGS: Generalized atrophy.

Normal ventricular morphology.

No midline shift or mass effect.

Old lacunar infarcts LEFT caudate head and RIGHT basal ganglia.

Old LEFT frontal infarct extending into insula.

Small vessel chronic ischemic changes of deep cerebral white matter.

No intracranial hemorrhage, mass lesion, evidence acute infarction.

No extra-axial fluid collections.

Partial opacification of RIGHT sphenoid sinus with osseous wall
thickening suggesting chronic sinusitis.

Remaining bones and sinuses unremarkable.

Atherosclerotic calcifications of the carotid siphons.
IMPRESSION: Atrophy with small vessel chronic ischemic changes of deep cerebral
white matter.

Old lacunar infarcts RIGHT basal ganglia and LEFT caudate head.

Old deep LEFT frontal infarct extending into insula.

No acute intracranial abnormalities.

Findings called to Dr. Carrigan on 03/05/2015 at 1174 hours.

## 2017-01-24 DIAGNOSIS — I129 Hypertensive chronic kidney disease with stage 1 through stage 4 chronic kidney disease, or unspecified chronic kidney disease: Secondary | ICD-10-CM | POA: Diagnosis not present

## 2017-01-24 DIAGNOSIS — F0390 Unspecified dementia without behavioral disturbance: Secondary | ICD-10-CM | POA: Diagnosis not present

## 2017-01-24 DIAGNOSIS — N183 Chronic kidney disease, stage 3 (moderate): Secondary | ICD-10-CM | POA: Diagnosis not present

## 2017-01-24 DIAGNOSIS — E039 Hypothyroidism, unspecified: Secondary | ICD-10-CM | POA: Diagnosis not present

## 2017-01-24 DIAGNOSIS — I4891 Unspecified atrial fibrillation: Secondary | ICD-10-CM | POA: Diagnosis not present

## 2017-03-04 DIAGNOSIS — B351 Tinea unguium: Secondary | ICD-10-CM | POA: Diagnosis not present

## 2017-03-04 DIAGNOSIS — I739 Peripheral vascular disease, unspecified: Secondary | ICD-10-CM | POA: Diagnosis not present

## 2017-03-04 DIAGNOSIS — R262 Difficulty in walking, not elsewhere classified: Secondary | ICD-10-CM | POA: Diagnosis not present

## 2017-04-04 DIAGNOSIS — Z23 Encounter for immunization: Secondary | ICD-10-CM | POA: Diagnosis not present

## 2017-04-16 DIAGNOSIS — I959 Hypotension, unspecified: Secondary | ICD-10-CM | POA: Diagnosis not present

## 2017-04-16 DIAGNOSIS — I10 Essential (primary) hypertension: Secondary | ICD-10-CM | POA: Diagnosis not present

## 2017-04-16 DIAGNOSIS — F0390 Unspecified dementia without behavioral disturbance: Secondary | ICD-10-CM | POA: Diagnosis not present

## 2017-04-24 DIAGNOSIS — D649 Anemia, unspecified: Secondary | ICD-10-CM | POA: Diagnosis not present

## 2017-04-24 DIAGNOSIS — I10 Essential (primary) hypertension: Secondary | ICD-10-CM | POA: Diagnosis not present

## 2017-05-13 DIAGNOSIS — R5381 Other malaise: Secondary | ICD-10-CM | POA: Diagnosis not present

## 2017-05-13 DIAGNOSIS — R600 Localized edema: Secondary | ICD-10-CM | POA: Diagnosis not present

## 2017-05-13 DIAGNOSIS — R54 Age-related physical debility: Secondary | ICD-10-CM | POA: Diagnosis not present

## 2017-05-13 DIAGNOSIS — I482 Chronic atrial fibrillation: Secondary | ICD-10-CM | POA: Diagnosis not present

## 2017-05-13 DIAGNOSIS — I509 Heart failure, unspecified: Secondary | ICD-10-CM | POA: Diagnosis not present

## 2017-05-13 DIAGNOSIS — R0602 Shortness of breath: Secondary | ICD-10-CM | POA: Diagnosis not present

## 2017-05-14 DIAGNOSIS — R410 Disorientation, unspecified: Secondary | ICD-10-CM | POA: Diagnosis not present

## 2017-05-14 DIAGNOSIS — I350 Nonrheumatic aortic (valve) stenosis: Secondary | ICD-10-CM

## 2017-05-14 DIAGNOSIS — M7989 Other specified soft tissue disorders: Secondary | ICD-10-CM | POA: Diagnosis not present

## 2017-05-14 DIAGNOSIS — Z8701 Personal history of pneumonia (recurrent): Secondary | ICD-10-CM | POA: Diagnosis not present

## 2017-05-14 DIAGNOSIS — I5032 Chronic diastolic (congestive) heart failure: Secondary | ICD-10-CM

## 2017-05-14 DIAGNOSIS — Z66 Do not resuscitate: Secondary | ICD-10-CM | POA: Diagnosis present

## 2017-05-14 DIAGNOSIS — I10 Essential (primary) hypertension: Secondary | ICD-10-CM | POA: Diagnosis present

## 2017-05-14 DIAGNOSIS — G9340 Encephalopathy, unspecified: Secondary | ICD-10-CM

## 2017-05-14 DIAGNOSIS — I482 Chronic atrial fibrillation: Secondary | ICD-10-CM | POA: Diagnosis present

## 2017-05-14 DIAGNOSIS — J189 Pneumonia, unspecified organism: Secondary | ICD-10-CM

## 2017-05-14 DIAGNOSIS — M1990 Unspecified osteoarthritis, unspecified site: Secondary | ICD-10-CM | POA: Diagnosis present

## 2017-05-14 DIAGNOSIS — M6281 Muscle weakness (generalized): Secondary | ICD-10-CM | POA: Diagnosis not present

## 2017-05-14 DIAGNOSIS — R451 Restlessness and agitation: Secondary | ICD-10-CM | POA: Diagnosis not present

## 2017-05-14 DIAGNOSIS — R1312 Dysphagia, oropharyngeal phase: Secondary | ICD-10-CM | POA: Diagnosis not present

## 2017-05-14 DIAGNOSIS — R41841 Cognitive communication deficit: Secondary | ICD-10-CM | POA: Diagnosis not present

## 2017-05-14 DIAGNOSIS — F419 Anxiety disorder, unspecified: Secondary | ICD-10-CM | POA: Diagnosis present

## 2017-05-14 DIAGNOSIS — I4891 Unspecified atrial fibrillation: Secondary | ICD-10-CM

## 2017-05-14 DIAGNOSIS — F4489 Other dissociative and conversion disorders: Secondary | ICD-10-CM | POA: Diagnosis not present

## 2017-05-14 DIAGNOSIS — F22 Delusional disorders: Secondary | ICD-10-CM | POA: Diagnosis not present

## 2017-05-14 DIAGNOSIS — F0150 Vascular dementia without behavioral disturbance: Secondary | ICD-10-CM

## 2017-05-14 DIAGNOSIS — Z8673 Personal history of transient ischemic attack (TIA), and cerebral infarction without residual deficits: Secondary | ICD-10-CM | POA: Diagnosis not present

## 2017-05-14 DIAGNOSIS — R278 Other lack of coordination: Secondary | ICD-10-CM | POA: Diagnosis not present

## 2017-05-14 DIAGNOSIS — I161 Hypertensive emergency: Secondary | ICD-10-CM | POA: Diagnosis present

## 2017-05-14 DIAGNOSIS — Z7982 Long term (current) use of aspirin: Secondary | ICD-10-CM | POA: Diagnosis not present

## 2017-05-14 DIAGNOSIS — R0602 Shortness of breath: Secondary | ICD-10-CM | POA: Diagnosis not present

## 2017-05-14 DIAGNOSIS — E875 Hyperkalemia: Secondary | ICD-10-CM | POA: Diagnosis not present

## 2017-05-14 DIAGNOSIS — I509 Heart failure, unspecified: Secondary | ICD-10-CM | POA: Diagnosis not present

## 2017-05-14 DIAGNOSIS — E039 Hypothyroidism, unspecified: Secondary | ICD-10-CM

## 2017-05-14 DIAGNOSIS — G9341 Metabolic encephalopathy: Secondary | ICD-10-CM | POA: Diagnosis present

## 2017-05-14 DIAGNOSIS — R262 Difficulty in walking, not elsewhere classified: Secondary | ICD-10-CM | POA: Diagnosis not present

## 2017-05-14 DIAGNOSIS — Z79899 Other long term (current) drug therapy: Secondary | ICD-10-CM | POA: Diagnosis not present

## 2017-05-14 DIAGNOSIS — N390 Urinary tract infection, site not specified: Secondary | ICD-10-CM | POA: Diagnosis not present

## 2017-05-14 DIAGNOSIS — R531 Weakness: Secondary | ICD-10-CM | POA: Diagnosis not present

## 2017-05-14 DIAGNOSIS — R4182 Altered mental status, unspecified: Secondary | ICD-10-CM | POA: Diagnosis not present

## 2017-05-17 DIAGNOSIS — G8194 Hemiplegia, unspecified affecting left nondominant side: Secondary | ICD-10-CM | POA: Diagnosis present

## 2017-05-17 DIAGNOSIS — G9340 Encephalopathy, unspecified: Secondary | ICD-10-CM | POA: Diagnosis not present

## 2017-05-17 DIAGNOSIS — G9389 Other specified disorders of brain: Secondary | ICD-10-CM | POA: Diagnosis not present

## 2017-05-17 DIAGNOSIS — F0150 Vascular dementia without behavioral disturbance: Secondary | ICD-10-CM | POA: Diagnosis not present

## 2017-05-17 DIAGNOSIS — I509 Heart failure, unspecified: Secondary | ICD-10-CM | POA: Diagnosis not present

## 2017-05-17 DIAGNOSIS — R278 Other lack of coordination: Secondary | ICD-10-CM | POA: Diagnosis not present

## 2017-05-17 DIAGNOSIS — I63411 Cerebral infarction due to embolism of right middle cerebral artery: Secondary | ICD-10-CM | POA: Diagnosis present

## 2017-05-17 DIAGNOSIS — N390 Urinary tract infection, site not specified: Secondary | ICD-10-CM | POA: Diagnosis not present

## 2017-05-17 DIAGNOSIS — M6281 Muscle weakness (generalized): Secondary | ICD-10-CM | POA: Diagnosis not present

## 2017-05-17 DIAGNOSIS — Z9282 Status post administration of tPA (rtPA) in a different facility within the last 24 hours prior to admission to current facility: Secondary | ICD-10-CM | POA: Diagnosis not present

## 2017-05-17 DIAGNOSIS — R51 Headache: Secondary | ICD-10-CM | POA: Diagnosis not present

## 2017-05-17 DIAGNOSIS — I639 Cerebral infarction, unspecified: Secondary | ICD-10-CM | POA: Diagnosis not present

## 2017-05-17 DIAGNOSIS — G319 Degenerative disease of nervous system, unspecified: Secondary | ICD-10-CM | POA: Diagnosis not present

## 2017-05-17 DIAGNOSIS — R4182 Altered mental status, unspecified: Secondary | ICD-10-CM | POA: Diagnosis not present

## 2017-05-17 DIAGNOSIS — I6789 Other cerebrovascular disease: Secondary | ICD-10-CM | POA: Diagnosis not present

## 2017-05-17 DIAGNOSIS — I5032 Chronic diastolic (congestive) heart failure: Secondary | ICD-10-CM | POA: Diagnosis not present

## 2017-05-17 DIAGNOSIS — I350 Nonrheumatic aortic (valve) stenosis: Secondary | ICD-10-CM | POA: Diagnosis not present

## 2017-05-17 DIAGNOSIS — R531 Weakness: Secondary | ICD-10-CM | POA: Diagnosis not present

## 2017-05-17 DIAGNOSIS — E039 Hypothyroidism, unspecified: Secondary | ICD-10-CM | POA: Diagnosis not present

## 2017-05-17 DIAGNOSIS — I10 Essential (primary) hypertension: Secondary | ICD-10-CM | POA: Diagnosis present

## 2017-05-17 DIAGNOSIS — R29716 NIHSS score 16: Secondary | ICD-10-CM | POA: Diagnosis not present

## 2017-05-17 DIAGNOSIS — R41841 Cognitive communication deficit: Secondary | ICD-10-CM | POA: Diagnosis not present

## 2017-05-17 DIAGNOSIS — R262 Difficulty in walking, not elsewhere classified: Secondary | ICD-10-CM | POA: Diagnosis not present

## 2017-05-17 DIAGNOSIS — R5381 Other malaise: Secondary | ICD-10-CM | POA: Diagnosis not present

## 2017-05-17 DIAGNOSIS — Z79899 Other long term (current) drug therapy: Secondary | ICD-10-CM | POA: Diagnosis not present

## 2017-05-17 DIAGNOSIS — I110 Hypertensive heart disease with heart failure: Secondary | ICD-10-CM | POA: Diagnosis not present

## 2017-05-17 DIAGNOSIS — J189 Pneumonia, unspecified organism: Secondary | ICD-10-CM | POA: Diagnosis not present

## 2017-05-17 DIAGNOSIS — R29705 NIHSS score 5: Secondary | ICD-10-CM | POA: Diagnosis present

## 2017-05-17 DIAGNOSIS — D649 Anemia, unspecified: Secondary | ICD-10-CM | POA: Diagnosis not present

## 2017-05-17 DIAGNOSIS — I5030 Unspecified diastolic (congestive) heart failure: Secondary | ICD-10-CM | POA: Diagnosis not present

## 2017-05-17 DIAGNOSIS — M25559 Pain in unspecified hip: Secondary | ICD-10-CM | POA: Diagnosis not present

## 2017-05-17 DIAGNOSIS — G9341 Metabolic encephalopathy: Secondary | ICD-10-CM | POA: Diagnosis not present

## 2017-05-17 DIAGNOSIS — F0390 Unspecified dementia without behavioral disturbance: Secondary | ICD-10-CM | POA: Diagnosis present

## 2017-05-17 DIAGNOSIS — I482 Chronic atrial fibrillation: Secondary | ICD-10-CM | POA: Diagnosis present

## 2017-05-17 DIAGNOSIS — I6601 Occlusion and stenosis of right middle cerebral artery: Secondary | ICD-10-CM | POA: Diagnosis not present

## 2017-05-17 DIAGNOSIS — J439 Emphysema, unspecified: Secondary | ICD-10-CM | POA: Diagnosis not present

## 2017-05-17 DIAGNOSIS — R1312 Dysphagia, oropharyngeal phase: Secondary | ICD-10-CM | POA: Diagnosis not present

## 2017-05-17 DIAGNOSIS — I4891 Unspecified atrial fibrillation: Secondary | ICD-10-CM | POA: Diagnosis not present

## 2017-05-17 DIAGNOSIS — G8191 Hemiplegia, unspecified affecting right dominant side: Secondary | ICD-10-CM | POA: Diagnosis not present

## 2017-05-17 DIAGNOSIS — R4781 Slurred speech: Secondary | ICD-10-CM | POA: Diagnosis not present

## 2017-05-17 DIAGNOSIS — Z7901 Long term (current) use of anticoagulants: Secondary | ICD-10-CM | POA: Diagnosis not present

## 2017-05-17 DIAGNOSIS — R079 Chest pain, unspecified: Secondary | ICD-10-CM | POA: Diagnosis not present

## 2017-05-20 DIAGNOSIS — I5030 Unspecified diastolic (congestive) heart failure: Secondary | ICD-10-CM | POA: Diagnosis not present

## 2017-05-20 DIAGNOSIS — R5381 Other malaise: Secondary | ICD-10-CM | POA: Diagnosis not present

## 2017-05-20 DIAGNOSIS — J439 Emphysema, unspecified: Secondary | ICD-10-CM | POA: Diagnosis not present

## 2017-05-20 DIAGNOSIS — I110 Hypertensive heart disease with heart failure: Secondary | ICD-10-CM | POA: Diagnosis not present

## 2017-05-22 DIAGNOSIS — R41841 Cognitive communication deficit: Secondary | ICD-10-CM | POA: Diagnosis not present

## 2017-05-22 DIAGNOSIS — G319 Degenerative disease of nervous system, unspecified: Secondary | ICD-10-CM | POA: Diagnosis not present

## 2017-05-22 DIAGNOSIS — R29705 NIHSS score 5: Secondary | ICD-10-CM | POA: Diagnosis not present

## 2017-05-22 DIAGNOSIS — I509 Heart failure, unspecified: Secondary | ICD-10-CM | POA: Diagnosis not present

## 2017-05-22 DIAGNOSIS — R29716 NIHSS score 16: Secondary | ICD-10-CM | POA: Diagnosis not present

## 2017-05-22 DIAGNOSIS — I10 Essential (primary) hypertension: Secondary | ICD-10-CM | POA: Diagnosis not present

## 2017-05-22 DIAGNOSIS — I6389 Other cerebral infarction: Secondary | ICD-10-CM | POA: Diagnosis not present

## 2017-05-22 DIAGNOSIS — R51 Headache: Secondary | ICD-10-CM | POA: Diagnosis not present

## 2017-05-22 DIAGNOSIS — E039 Hypothyroidism, unspecified: Secondary | ICD-10-CM | POA: Diagnosis not present

## 2017-05-22 DIAGNOSIS — R278 Other lack of coordination: Secondary | ICD-10-CM | POA: Diagnosis not present

## 2017-05-22 DIAGNOSIS — R5383 Other fatigue: Secondary | ICD-10-CM | POA: Diagnosis not present

## 2017-05-22 DIAGNOSIS — J189 Pneumonia, unspecified organism: Secondary | ICD-10-CM | POA: Diagnosis not present

## 2017-05-22 DIAGNOSIS — Z7901 Long term (current) use of anticoagulants: Secondary | ICD-10-CM | POA: Diagnosis not present

## 2017-05-22 DIAGNOSIS — I6502 Occlusion and stenosis of left vertebral artery: Secondary | ICD-10-CM | POA: Diagnosis not present

## 2017-05-22 DIAGNOSIS — G9389 Other specified disorders of brain: Secondary | ICD-10-CM | POA: Diagnosis not present

## 2017-05-22 DIAGNOSIS — F0390 Unspecified dementia without behavioral disturbance: Secondary | ICD-10-CM | POA: Diagnosis present

## 2017-05-22 DIAGNOSIS — R1312 Dysphagia, oropharyngeal phase: Secondary | ICD-10-CM | POA: Diagnosis not present

## 2017-05-22 DIAGNOSIS — R2681 Unsteadiness on feet: Secondary | ICD-10-CM | POA: Diagnosis not present

## 2017-05-22 DIAGNOSIS — I083 Combined rheumatic disorders of mitral, aortic and tricuspid valves: Secondary | ICD-10-CM | POA: Diagnosis not present

## 2017-05-22 DIAGNOSIS — G8194 Hemiplegia, unspecified affecting left nondominant side: Secondary | ICD-10-CM | POA: Diagnosis not present

## 2017-05-22 DIAGNOSIS — I6601 Occlusion and stenosis of right middle cerebral artery: Secondary | ICD-10-CM | POA: Diagnosis not present

## 2017-05-22 DIAGNOSIS — D649 Anemia, unspecified: Secondary | ICD-10-CM | POA: Diagnosis not present

## 2017-05-22 DIAGNOSIS — I63411 Cerebral infarction due to embolism of right middle cerebral artery: Secondary | ICD-10-CM | POA: Diagnosis not present

## 2017-05-22 DIAGNOSIS — Z8673 Personal history of transient ischemic attack (TIA), and cerebral infarction without residual deficits: Secondary | ICD-10-CM | POA: Diagnosis not present

## 2017-05-22 DIAGNOSIS — I4891 Unspecified atrial fibrillation: Secondary | ICD-10-CM | POA: Diagnosis not present

## 2017-05-22 DIAGNOSIS — I6789 Other cerebrovascular disease: Secondary | ICD-10-CM | POA: Diagnosis not present

## 2017-05-22 DIAGNOSIS — I63412 Cerebral infarction due to embolism of left middle cerebral artery: Secondary | ICD-10-CM | POA: Diagnosis not present

## 2017-05-22 DIAGNOSIS — G9341 Metabolic encephalopathy: Secondary | ICD-10-CM | POA: Diagnosis not present

## 2017-05-22 DIAGNOSIS — I639 Cerebral infarction, unspecified: Secondary | ICD-10-CM | POA: Diagnosis not present

## 2017-05-22 DIAGNOSIS — G8191 Hemiplegia, unspecified affecting right dominant side: Secondary | ICD-10-CM | POA: Diagnosis not present

## 2017-05-22 DIAGNOSIS — M6281 Muscle weakness (generalized): Secondary | ICD-10-CM | POA: Diagnosis not present

## 2017-05-22 DIAGNOSIS — R079 Chest pain, unspecified: Secondary | ICD-10-CM | POA: Diagnosis not present

## 2017-05-22 DIAGNOSIS — I482 Chronic atrial fibrillation: Secondary | ICD-10-CM | POA: Diagnosis not present

## 2017-05-22 DIAGNOSIS — R9082 White matter disease, unspecified: Secondary | ICD-10-CM | POA: Diagnosis not present

## 2017-05-22 DIAGNOSIS — R531 Weakness: Secondary | ICD-10-CM | POA: Diagnosis not present

## 2017-05-22 DIAGNOSIS — Z9282 Status post administration of tPA (rtPA) in a different facility within the last 24 hours prior to admission to current facility: Secondary | ICD-10-CM | POA: Diagnosis not present

## 2017-05-22 DIAGNOSIS — R4781 Slurred speech: Secondary | ICD-10-CM | POA: Diagnosis not present

## 2017-05-22 DIAGNOSIS — R2689 Other abnormalities of gait and mobility: Secondary | ICD-10-CM | POA: Diagnosis not present

## 2017-05-27 DIAGNOSIS — L608 Other nail disorders: Secondary | ICD-10-CM | POA: Diagnosis not present

## 2017-05-27 DIAGNOSIS — R1312 Dysphagia, oropharyngeal phase: Secondary | ICD-10-CM | POA: Diagnosis not present

## 2017-05-27 DIAGNOSIS — I5030 Unspecified diastolic (congestive) heart failure: Secondary | ICD-10-CM | POA: Diagnosis not present

## 2017-05-27 DIAGNOSIS — R531 Weakness: Secondary | ICD-10-CM | POA: Diagnosis not present

## 2017-05-27 DIAGNOSIS — R2689 Other abnormalities of gait and mobility: Secondary | ICD-10-CM | POA: Diagnosis not present

## 2017-05-27 DIAGNOSIS — G9341 Metabolic encephalopathy: Secondary | ICD-10-CM | POA: Diagnosis not present

## 2017-05-27 DIAGNOSIS — F0390 Unspecified dementia without behavioral disturbance: Secondary | ICD-10-CM | POA: Diagnosis not present

## 2017-05-27 DIAGNOSIS — I509 Heart failure, unspecified: Secondary | ICD-10-CM | POA: Diagnosis not present

## 2017-05-27 DIAGNOSIS — J189 Pneumonia, unspecified organism: Secondary | ICD-10-CM | POA: Diagnosis not present

## 2017-05-27 DIAGNOSIS — M6281 Muscle weakness (generalized): Secondary | ICD-10-CM | POA: Diagnosis not present

## 2017-05-27 DIAGNOSIS — I10 Essential (primary) hypertension: Secondary | ICD-10-CM | POA: Diagnosis not present

## 2017-05-27 DIAGNOSIS — I639 Cerebral infarction, unspecified: Secondary | ICD-10-CM | POA: Diagnosis not present

## 2017-05-27 DIAGNOSIS — R278 Other lack of coordination: Secondary | ICD-10-CM | POA: Diagnosis not present

## 2017-05-27 DIAGNOSIS — E870 Hyperosmolality and hypernatremia: Secondary | ICD-10-CM | POA: Diagnosis not present

## 2017-05-27 DIAGNOSIS — R41841 Cognitive communication deficit: Secondary | ICD-10-CM | POA: Diagnosis not present

## 2017-05-27 DIAGNOSIS — R2681 Unsteadiness on feet: Secondary | ICD-10-CM | POA: Diagnosis not present

## 2017-05-27 DIAGNOSIS — I6990 Unspecified sequelae of unspecified cerebrovascular disease: Secondary | ICD-10-CM | POA: Diagnosis not present

## 2017-05-27 DIAGNOSIS — R05 Cough: Secondary | ICD-10-CM | POA: Diagnosis not present

## 2017-05-27 DIAGNOSIS — I63412 Cerebral infarction due to embolism of left middle cerebral artery: Secondary | ICD-10-CM | POA: Diagnosis not present

## 2017-05-27 DIAGNOSIS — R5383 Other fatigue: Secondary | ICD-10-CM | POA: Diagnosis not present

## 2017-05-27 DIAGNOSIS — I482 Chronic atrial fibrillation: Secondary | ICD-10-CM | POA: Diagnosis not present

## 2017-06-03 DIAGNOSIS — E870 Hyperosmolality and hypernatremia: Secondary | ICD-10-CM | POA: Diagnosis not present

## 2017-06-03 DIAGNOSIS — I6990 Unspecified sequelae of unspecified cerebrovascular disease: Secondary | ICD-10-CM | POA: Diagnosis not present

## 2017-06-07 DIAGNOSIS — R05 Cough: Secondary | ICD-10-CM | POA: Diagnosis not present

## 2017-07-01 DIAGNOSIS — L608 Other nail disorders: Secondary | ICD-10-CM | POA: Diagnosis not present

## 2017-07-01 DIAGNOSIS — I5030 Unspecified diastolic (congestive) heart failure: Secondary | ICD-10-CM | POA: Diagnosis not present

## 2017-07-08 DIAGNOSIS — S81811A Laceration without foreign body, right lower leg, initial encounter: Secondary | ICD-10-CM | POA: Diagnosis not present

## 2017-07-08 DIAGNOSIS — Z8673 Personal history of transient ischemic attack (TIA), and cerebral infarction without residual deficits: Secondary | ICD-10-CM | POA: Diagnosis not present

## 2017-07-08 DIAGNOSIS — M79604 Pain in right leg: Secondary | ICD-10-CM | POA: Diagnosis not present

## 2017-07-08 DIAGNOSIS — R4182 Altered mental status, unspecified: Secondary | ICD-10-CM | POA: Diagnosis not present

## 2017-07-08 DIAGNOSIS — M25551 Pain in right hip: Secondary | ICD-10-CM | POA: Diagnosis not present

## 2017-07-08 DIAGNOSIS — F0390 Unspecified dementia without behavioral disturbance: Secondary | ICD-10-CM | POA: Diagnosis not present

## 2017-07-08 DIAGNOSIS — S79911A Unspecified injury of right hip, initial encounter: Secondary | ICD-10-CM | POA: Diagnosis not present

## 2017-07-08 DIAGNOSIS — G8911 Acute pain due to trauma: Secondary | ICD-10-CM | POA: Diagnosis not present

## 2017-07-09 DIAGNOSIS — Z79899 Other long term (current) drug therapy: Secondary | ICD-10-CM | POA: Diagnosis not present

## 2017-07-09 DIAGNOSIS — R0902 Hypoxemia: Secondary | ICD-10-CM | POA: Diagnosis not present

## 2017-07-11 DIAGNOSIS — B351 Tinea unguium: Secondary | ICD-10-CM | POA: Diagnosis not present

## 2017-07-11 DIAGNOSIS — L97521 Non-pressure chronic ulcer of other part of left foot limited to breakdown of skin: Secondary | ICD-10-CM | POA: Diagnosis not present

## 2017-07-11 DIAGNOSIS — M79675 Pain in left toe(s): Secondary | ICD-10-CM | POA: Diagnosis not present

## 2017-07-11 DIAGNOSIS — I739 Peripheral vascular disease, unspecified: Secondary | ICD-10-CM | POA: Diagnosis not present

## 2017-07-12 DIAGNOSIS — J189 Pneumonia, unspecified organism: Secondary | ICD-10-CM | POA: Diagnosis not present

## 2017-07-12 DIAGNOSIS — I5030 Unspecified diastolic (congestive) heart failure: Secondary | ICD-10-CM | POA: Diagnosis not present

## 2017-07-13 DIAGNOSIS — R079 Chest pain, unspecified: Secondary | ICD-10-CM | POA: Diagnosis not present

## 2017-07-15 DIAGNOSIS — D509 Iron deficiency anemia, unspecified: Secondary | ICD-10-CM | POA: Diagnosis not present

## 2017-07-16 DIAGNOSIS — J189 Pneumonia, unspecified organism: Secondary | ICD-10-CM | POA: Diagnosis not present

## 2017-07-19 DIAGNOSIS — J811 Chronic pulmonary edema: Secondary | ICD-10-CM | POA: Diagnosis not present

## 2017-07-19 DIAGNOSIS — J189 Pneumonia, unspecified organism: Secondary | ICD-10-CM | POA: Diagnosis not present

## 2017-07-19 DIAGNOSIS — I5030 Unspecified diastolic (congestive) heart failure: Secondary | ICD-10-CM | POA: Diagnosis not present

## 2017-07-29 DIAGNOSIS — J439 Emphysema, unspecified: Secondary | ICD-10-CM | POA: Diagnosis not present

## 2017-07-29 DIAGNOSIS — I272 Pulmonary hypertension, unspecified: Secondary | ICD-10-CM | POA: Diagnosis not present

## 2017-07-29 DIAGNOSIS — I5030 Unspecified diastolic (congestive) heart failure: Secondary | ICD-10-CM | POA: Diagnosis not present

## 2017-07-29 DIAGNOSIS — E782 Mixed hyperlipidemia: Secondary | ICD-10-CM | POA: Diagnosis not present

## 2017-07-30 DIAGNOSIS — Z9181 History of falling: Secondary | ICD-10-CM | POA: Diagnosis not present

## 2017-07-30 DIAGNOSIS — R2681 Unsteadiness on feet: Secondary | ICD-10-CM | POA: Diagnosis not present

## 2017-07-30 DIAGNOSIS — J189 Pneumonia, unspecified organism: Secondary | ICD-10-CM | POA: Diagnosis not present

## 2017-07-30 DIAGNOSIS — I509 Heart failure, unspecified: Secondary | ICD-10-CM | POA: Diagnosis not present

## 2017-07-30 DIAGNOSIS — F0390 Unspecified dementia without behavioral disturbance: Secondary | ICD-10-CM | POA: Diagnosis not present

## 2017-07-30 DIAGNOSIS — I10 Essential (primary) hypertension: Secondary | ICD-10-CM | POA: Diagnosis not present

## 2017-07-30 DIAGNOSIS — I482 Chronic atrial fibrillation: Secondary | ICD-10-CM | POA: Diagnosis not present

## 2017-07-30 DIAGNOSIS — M6281 Muscle weakness (generalized): Secondary | ICD-10-CM | POA: Diagnosis not present

## 2017-07-30 DIAGNOSIS — R278 Other lack of coordination: Secondary | ICD-10-CM | POA: Diagnosis not present

## 2017-07-30 DIAGNOSIS — R5383 Other fatigue: Secondary | ICD-10-CM | POA: Diagnosis not present

## 2017-07-30 DIAGNOSIS — G9341 Metabolic encephalopathy: Secondary | ICD-10-CM | POA: Diagnosis not present

## 2017-07-30 DIAGNOSIS — R2689 Other abnormalities of gait and mobility: Secondary | ICD-10-CM | POA: Diagnosis not present

## 2017-07-30 DIAGNOSIS — R1312 Dysphagia, oropharyngeal phase: Secondary | ICD-10-CM | POA: Diagnosis not present

## 2017-07-30 DIAGNOSIS — I63412 Cerebral infarction due to embolism of left middle cerebral artery: Secondary | ICD-10-CM | POA: Diagnosis not present

## 2017-07-30 DIAGNOSIS — R293 Abnormal posture: Secondary | ICD-10-CM | POA: Diagnosis not present

## 2017-08-22 DIAGNOSIS — R0602 Shortness of breath: Secondary | ICD-10-CM | POA: Diagnosis not present

## 2017-09-06 DEATH — deceased
